# Patient Record
Sex: Female | Born: 1965 | Race: White | Hispanic: No | Smoking: Current every day smoker
Health system: Southern US, Community
[De-identification: ages and names within clinical notes are randomized; demographics above are authoritative.]

## PROBLEM LIST (undated history)

## (undated) DIAGNOSIS — E7800 Pure hypercholesterolemia, unspecified: Secondary | ICD-10-CM

## (undated) DIAGNOSIS — I10 Essential (primary) hypertension: Secondary | ICD-10-CM

## (undated) DIAGNOSIS — I639 Cerebral infarction, unspecified: Secondary | ICD-10-CM

## (undated) HISTORY — PX: ABDOMINAL HYSTERECTOMY: SHX81

## (undated) HISTORY — PX: TONSILLECTOMY: SUR1361

## (undated) HISTORY — DX: Pure hypercholesterolemia, unspecified: E78.00

## (undated) HISTORY — DX: Cerebral infarction, unspecified: I63.9

## (undated) HISTORY — DX: Essential (primary) hypertension: I10

## (undated) HISTORY — PX: LEEP: SHX91

---

## 2002-07-10 ENCOUNTER — Other Ambulatory Visit: Admission: RE | Admit: 2002-07-10 | Discharge: 2002-07-10 | Payer: Self-pay | Admitting: Gynecology

## 2002-12-24 ENCOUNTER — Other Ambulatory Visit: Admission: RE | Admit: 2002-12-24 | Discharge: 2002-12-24 | Payer: Self-pay | Admitting: Gynecology

## 2003-04-09 ENCOUNTER — Other Ambulatory Visit: Admission: RE | Admit: 2003-04-09 | Discharge: 2003-04-09 | Payer: Self-pay | Admitting: Gynecology

## 2003-11-17 ENCOUNTER — Other Ambulatory Visit: Admission: RE | Admit: 2003-11-17 | Discharge: 2003-11-17 | Payer: Self-pay | Admitting: Gynecology

## 2004-06-01 ENCOUNTER — Other Ambulatory Visit: Admission: RE | Admit: 2004-06-01 | Discharge: 2004-06-01 | Payer: Self-pay | Admitting: Gynecology

## 2004-11-17 ENCOUNTER — Other Ambulatory Visit: Admission: RE | Admit: 2004-11-17 | Discharge: 2004-11-17 | Payer: Self-pay | Admitting: Gynecology

## 2005-03-18 ENCOUNTER — Ambulatory Visit (HOSPITAL_COMMUNITY): Admission: RE | Admit: 2005-03-18 | Discharge: 2005-03-18 | Payer: Self-pay | Admitting: Gynecology

## 2005-07-19 ENCOUNTER — Other Ambulatory Visit: Admission: RE | Admit: 2005-07-19 | Discharge: 2005-07-19 | Payer: Self-pay | Admitting: Gynecology

## 2006-03-16 ENCOUNTER — Other Ambulatory Visit: Admission: RE | Admit: 2006-03-16 | Discharge: 2006-03-16 | Payer: Self-pay | Admitting: Gynecology

## 2006-05-26 ENCOUNTER — Ambulatory Visit (HOSPITAL_COMMUNITY): Admission: RE | Admit: 2006-05-26 | Discharge: 2006-05-26 | Payer: Self-pay | Admitting: Gynecology

## 2006-11-20 ENCOUNTER — Other Ambulatory Visit: Admission: RE | Admit: 2006-11-20 | Discharge: 2006-11-20 | Payer: Self-pay | Admitting: Gynecology

## 2007-05-08 ENCOUNTER — Other Ambulatory Visit: Admission: RE | Admit: 2007-05-08 | Discharge: 2007-05-08 | Payer: Self-pay | Admitting: Gynecology

## 2007-08-22 ENCOUNTER — Ambulatory Visit (HOSPITAL_COMMUNITY): Admission: RE | Admit: 2007-08-22 | Discharge: 2007-08-22 | Payer: Self-pay | Admitting: Gynecology

## 2007-11-21 ENCOUNTER — Other Ambulatory Visit: Admission: RE | Admit: 2007-11-21 | Discharge: 2007-11-21 | Payer: Self-pay | Admitting: Gynecology

## 2008-05-20 ENCOUNTER — Other Ambulatory Visit: Admission: RE | Admit: 2008-05-20 | Discharge: 2008-05-20 | Payer: Self-pay | Admitting: Gynecology

## 2008-12-18 ENCOUNTER — Ambulatory Visit (HOSPITAL_COMMUNITY): Admission: RE | Admit: 2008-12-18 | Discharge: 2008-12-18 | Payer: Self-pay | Admitting: Gynecology

## 2009-01-28 ENCOUNTER — Inpatient Hospital Stay (HOSPITAL_COMMUNITY): Admission: RE | Admit: 2009-01-28 | Discharge: 2009-01-29 | Payer: Self-pay | Admitting: Obstetrics and Gynecology

## 2009-01-28 ENCOUNTER — Encounter (INDEPENDENT_AMBULATORY_CARE_PROVIDER_SITE_OTHER): Payer: Self-pay | Admitting: Obstetrics and Gynecology

## 2009-09-27 ENCOUNTER — Ambulatory Visit: Payer: Self-pay | Admitting: Diagnostic Radiology

## 2009-09-27 ENCOUNTER — Emergency Department (HOSPITAL_BASED_OUTPATIENT_CLINIC_OR_DEPARTMENT_OTHER): Admission: EM | Admit: 2009-09-27 | Discharge: 2009-09-27 | Payer: Self-pay | Admitting: Emergency Medicine

## 2009-10-01 ENCOUNTER — Ambulatory Visit (HOSPITAL_BASED_OUTPATIENT_CLINIC_OR_DEPARTMENT_OTHER): Admission: RE | Admit: 2009-10-01 | Discharge: 2009-10-01 | Payer: Self-pay | Admitting: Orthopedic Surgery

## 2009-12-22 ENCOUNTER — Ambulatory Visit (HOSPITAL_COMMUNITY): Admission: RE | Admit: 2009-12-22 | Discharge: 2009-12-22 | Payer: Self-pay | Admitting: Gynecology

## 2010-06-21 ENCOUNTER — Ambulatory Visit (HOSPITAL_BASED_OUTPATIENT_CLINIC_OR_DEPARTMENT_OTHER)
Admission: RE | Admit: 2010-06-21 | Discharge: 2010-06-21 | Payer: Self-pay | Source: Home / Self Care | Attending: Family Medicine | Admitting: Family Medicine

## 2010-08-24 LAB — POCT HEMOGLOBIN-HEMACUE: Hemoglobin: 16.5 g/dL — ABNORMAL HIGH (ref 12.0–15.0)

## 2010-09-11 LAB — COMPREHENSIVE METABOLIC PANEL
ALT: 27 U/L (ref 0–35)
AST: 24 U/L (ref 0–37)
Albumin: 4.1 g/dL (ref 3.5–5.2)
Alkaline Phosphatase: 60 U/L (ref 39–117)
BUN: 12 mg/dL (ref 6–23)
CO2: 26 mEq/L (ref 19–32)
Calcium: 9.2 mg/dL (ref 8.4–10.5)
Chloride: 103 mEq/L (ref 96–112)
Creatinine, Ser: 0.67 mg/dL (ref 0.4–1.2)
GFR calc Af Amer: >60 mL/min (ref >60)
GFR calc non Af Amer: >60 mL/min (ref >60)
Glucose, Bld: 92 mg/dL (ref 70–99)
Potassium: 4.3 mEq/L (ref 3.5–5.1)
Sodium: 137 mEq/L (ref 135–145)
Total Bilirubin: 0.8 mg/dL (ref 0.3–1.2)
Total Protein: 7.3 g/dL (ref 6.0–8.3)

## 2010-09-11 LAB — CBC
HCT: 36.3 % (ref 36.0–46.0)
HCT: 46.6 % — ABNORMAL HIGH (ref 36.0–46.0)
Hemoglobin: 12.7 g/dL (ref 12.0–15.0)
Hemoglobin: 15.8 g/dL — ABNORMAL HIGH (ref 12.0–15.0)
MCHC: 34 g/dL (ref 30.0–36.0)
MCHC: 35.1 g/dL (ref 30.0–36.0)
MCV: 95.3 fL (ref 78.0–100.0)
MCV: 95.7 fL (ref 78.0–100.0)
Platelets: 318 10*3/uL (ref 150–400)
Platelets: 339 10*3/uL (ref 150–400)
RBC: 3.81 MIL/uL — ABNORMAL LOW (ref 3.87–5.11)
RBC: 4.87 MIL/uL (ref 3.87–5.11)
RDW: 12.9 % (ref 11.5–15.5)
RDW: 13.1 % (ref 11.5–15.5)
WBC: 11.4 10*3/uL — ABNORMAL HIGH (ref 4.0–10.5)
WBC: 18.9 10*3/uL — ABNORMAL HIGH (ref 4.0–10.5)

## 2010-09-11 LAB — PREGNANCY, URINE: Preg Test, Ur: NEGATIVE

## 2010-10-19 NOTE — Op Note (Signed)
Jasmin Cruz, Jasmin Cruz             ACCOUNT NO.:  [id number]   MEDICAL RECORD NO.:  [id number]          PATIENT TYPE:  INP   LOCATION:  9302                          FACILITY:  [facility]   PHYSICIAN:  Michelle L. Grewal, M.D.DATE OF BIRTH:  1965/09/25   DATE OF PROCEDURE:  01/28/2009  DATE OF DISCHARGE:                               OPERATIVE REPORT   PREOPERATIVE DIAGNOSIS:  Symptomatic fibroids.   POSTOPERATIVE DIAGNOSIS:  Symptomatic fibroids.   PROCEDURE:  Total abdominal hysterectomy.   SURGEON:  Michelle L. Vincente Poli, M.D.   ASSISTANT:  Dr. Greta Doom.   ANESTHESIA:  General.   FINDINGS:  A 750 g fibroid uterus sent to Pathology.   SPECIMENS:  Uterus and cervix.   ESTIMATED BLOOD LOSS:  200 mL.   DRAINS:  Foley catheter.   COMPLICATIONS:  None.   PROCEDURE IN DETAIL:  The patient was taken to the operating room.  She  had been consented in the office in regards to the risk associated with  surgery which included the risk of anesthesia, risk of injury, the bowel  and bladder surrounding organs, risk of bleeding, risk of transfusion,  risk of pulmonary embolism, risk of venous embolism and risk of  infection.  She was intubated without difficulty.  She was prepped and  draped in the usual sterile fashion.  Foley catheter was inserted.  Time-  out was performed according to the JCAHO guidelines.  A low transverse  incision was made, carried down to the fascia.  Fascia was scored in the  midline extended laterally.  The rectus muscle was separated in the  midline.  The peritoneum was entered bluntly.  The peritoneal incision  was then stretched.  The uterus was then exteriorized.  It was markedly  enlarged and multiple fibroids were noted.  The ovaries were  unremarkable and tubes appeared normal.  There were no adhesions noted.  We started with the hysterectomy by identifying the round ligament on  the left side but suture ligating that and transecting it with a Bovie,  creating  an avascular window beneath the triple pedicle and placing  curved Heaney clamp across the triple pedicle.  The pedicle was clamped  and cut and suture ligated using 0 Vicryl suture and then secured using  a free tie of 0 Vicryl suture.  This was done on the left side and the  right side identically and a bladder flap sharply and then the bladder  flap was then developed very well.  We then skeletonized uterine artery  on either side and the uterine artery was clamped at the level of  internal os using curved Heaney clamps on each side.  The pedicles were  clamped, cut, and suture ligated using 0 Vicryl suture.  The uterus was  then amputated so that we could see the cervix more clearly because the  uterus was so large because of the fibroids.  After we amputated the  uterine fundus, we then placed in a retractor and then placed the larger  small bowel in upper abdomen gently with some laparotomy pads.  We then  grasped the cervix with  a Kocher clamp and developed the bladder flap  further with sharp and blunt dissection and then used a series of  straight curved Heaney clamps by staying just snug beside the cervix and  clamping the uterosacral cardinal ligaments.  Each pedicle was clamped,  cut, and suture ligated using 0 Vicryl suture.  We then could easily see  the external os kind of bulging up anteriorly, so we placed curved  Heaney clamps just beneath the external os, making sure the bladder was  well below our clamp and then the cervix was removed using Mayo  scissors.  The angle stitches were placed using 0 Vicryl suture  interrupted and remainder of cuff was closed with 0 Vicryl figure-of-  eight.  The specimen was 750 g and the uterus and cervix were sent  together to pathology.  Irrigation was performed.  There was no bleeding  noted whatsoever.  All instruments and laparotomy pads were removed.  The peritoneum was closed using 0 Vicryl.  The rectus muscles were  reapproximated  using 0 Vicryl.  The fascia was closed using 0 Vicryl  starting each corner meeting in the midline.  After irrigation of  subcutaneous layer, the skin was closed with staples.  All sponge, lap  and instrument counts were correct x2.  The patient went to recovery  room in stable condition.      Michelle L. Vincente Poli, M.D.  Electronically Signed     MLG/MEDQ  D:  01/28/2009  T:  01/29/2009  Job:  [id number]

## 2010-10-19 NOTE — H&P (Signed)
Jasmin Cruz, Jasmin Cruz             ACCOUNT NO.:  [id number]   MEDICAL RECORD NO.:  [id number]          PATIENT TYPE:  AMB   LOCATION:  SDC                           FACILITY:  [facility]   PHYSICIAN:  Michelle L. Grewal, M.D.DATE OF BIRTH:  [birth year]   DATE OF ADMISSION:  DATE OF DISCHARGE:                              HISTORY & PHYSICAL   HISTORY OF PRESENT ILLNESS:  This is a 45 year old gravida 1, para 0 who  presents today for TAH, possible BSO.  She has been having problems with  pelvic pressure, dribbling of urine, and irregular bleeding with large  clots.  She is currently on generic Micronor.  She had an ultrasound  that showed multiple fibroids, some as large as 8 cm.  She also reports  a lot of bloating in her lower abdomen.  She is scheduled today for TAH,  possible BSO.   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   FAMILY HISTORY:  Remarkable for heart disease, kidney disease, diabetes,  and lung cancers.   CURRENT MEDICATIONS:  Generic Micronor.   SURGERIES:  None.   ALLERGIES:  None.   SOCIAL HISTORY:  She does smoke one half pack per day.  She reports  occasional alcohol use.  She is currently divorced.   PRIMARY CARE PHYSICIAN:  [facility] physicians.   REVIEW OF SYSTEMS:  Positive for weight loss and gain.   PHYSICAL EXAMINATION:  VITAL SIGNS:  Height 5 feet 3-1/2 inches, weight  179, and BP 128/82.  GENERAL:  Alert and oriented in no apparent distress.  LUNGS:  Clear to auscultation bilaterally.  CARDIAC:  Regular rate and rhythm.  BREASTS:  Soft and nontender.  No masses.  ABDOMEN:  Protuberant and mass noted at the umbilicus.  PELVIC:  External genitalia within normal limits.  Vagina appears  normal.  Cervix, no lesions.  Uterus is approximately 18 weeks' size.   IMPRESSION:  Symptomatic fibroids.   PLAN:  We will proceed with TAH, possible BSO.  The patient requested  nicotine patch in the  hospital postop.  Risk of the procedure was  discussed with the patient, which include the risk of infection,  bleeding, anesthesia, injury to internal organs such as bowel and  bladder, and risk of pulmonary embolism and thromboembolism.  We will  proceed with surgery.      Michelle L. Vincente Poli, M.D.  Electronically Signed     MLG/MEDQ  D:  01/27/2009  T:  01/28/2009  Job:  [id number]

## 2010-12-14 ENCOUNTER — Other Ambulatory Visit (HOSPITAL_COMMUNITY): Payer: Self-pay | Admitting: Gynecology

## 2010-12-14 ENCOUNTER — Other Ambulatory Visit (HOSPITAL_BASED_OUTPATIENT_CLINIC_OR_DEPARTMENT_OTHER): Payer: Self-pay | Admitting: Gynecology

## 2010-12-14 DIAGNOSIS — Z1231 Encounter for screening mammogram for malignant neoplasm of breast: Secondary | ICD-10-CM

## 2010-12-29 ENCOUNTER — Ambulatory Visit (HOSPITAL_BASED_OUTPATIENT_CLINIC_OR_DEPARTMENT_OTHER)
Admission: RE | Admit: 2010-12-29 | Discharge: 2010-12-29 | Disposition: A | Discharge status: Home / Self Care | Payer: BC Managed Care – PPO | Source: Ambulatory Visit | Attending: Gynecology | Admitting: Gynecology

## 2010-12-29 DIAGNOSIS — Z1231 Encounter for screening mammogram for malignant neoplasm of breast: Secondary | ICD-10-CM | POA: Insufficient documentation

## 2011-01-04 ENCOUNTER — Other Ambulatory Visit: Payer: Self-pay | Admitting: Gynecology

## 2011-01-04 DIAGNOSIS — R928 Other abnormal and inconclusive findings on diagnostic imaging of breast: Secondary | ICD-10-CM

## 2011-01-12 ENCOUNTER — Ambulatory Visit
Admission: RE | Admit: 2011-01-12 | Discharge: 2011-01-12 | Disposition: A | Discharge status: Home / Self Care | Payer: BC Managed Care – PPO | Source: Ambulatory Visit | Attending: Gynecology | Admitting: Gynecology

## 2011-01-12 DIAGNOSIS — R928 Other abnormal and inconclusive findings on diagnostic imaging of breast: Secondary | ICD-10-CM

## 2012-01-11 ENCOUNTER — Ambulatory Visit (HOSPITAL_BASED_OUTPATIENT_CLINIC_OR_DEPARTMENT_OTHER)
Admission: RE | Admit: 2012-01-11 | Discharge: 2012-01-11 | Disposition: A | Discharge status: Home / Self Care | Payer: BC Managed Care – PPO | Source: Ambulatory Visit | Attending: Gynecology | Admitting: Gynecology

## 2012-01-11 ENCOUNTER — Other Ambulatory Visit (HOSPITAL_BASED_OUTPATIENT_CLINIC_OR_DEPARTMENT_OTHER): Payer: Self-pay | Admitting: Gynecology

## 2012-01-11 DIAGNOSIS — Z1231 Encounter for screening mammogram for malignant neoplasm of breast: Secondary | ICD-10-CM

## 2012-02-27 ENCOUNTER — Inpatient Hospital Stay (HOSPITAL_COMMUNITY)
Admission: EM | Admit: 2012-02-27 | Discharge: 2012-03-02 | DRG: 533 | Disposition: A | Discharge status: Home / Self Care | Payer: BC Managed Care – PPO | Source: Other Acute Inpatient Hospital | Attending: Neurology | Admitting: Neurology

## 2012-02-27 ENCOUNTER — Encounter (HOSPITAL_COMMUNITY): Payer: Self-pay | Admitting: *Deleted

## 2012-02-27 ENCOUNTER — Inpatient Hospital Stay (HOSPITAL_COMMUNITY): Payer: BC Managed Care – PPO

## 2012-02-27 DIAGNOSIS — Z79899 Other long term (current) drug therapy: Secondary | ICD-10-CM

## 2012-02-27 DIAGNOSIS — E785 Hyperlipidemia, unspecified: Secondary | ICD-10-CM | POA: Diagnosis present

## 2012-02-27 DIAGNOSIS — R51 Headache: Secondary | ICD-10-CM | POA: Diagnosis present

## 2012-02-27 DIAGNOSIS — I677 Cerebral arteritis, not elsewhere classified: Secondary | ICD-10-CM | POA: Diagnosis present

## 2012-02-27 DIAGNOSIS — H5340 Unspecified visual field defects: Secondary | ICD-10-CM | POA: Diagnosis present

## 2012-02-27 DIAGNOSIS — F1210 Cannabis abuse, uncomplicated: Secondary | ICD-10-CM | POA: Diagnosis present

## 2012-02-27 DIAGNOSIS — I10 Essential (primary) hypertension: Secondary | ICD-10-CM | POA: Diagnosis present

## 2012-02-27 DIAGNOSIS — I609 Nontraumatic subarachnoid hemorrhage, unspecified: Principal | ICD-10-CM | POA: Diagnosis present

## 2012-02-27 DIAGNOSIS — I498 Other specified cardiac arrhythmias: Secondary | ICD-10-CM | POA: Diagnosis present

## 2012-02-27 DIAGNOSIS — H53469 Homonymous bilateral field defects, unspecified side: Secondary | ICD-10-CM | POA: Diagnosis present

## 2012-02-27 DIAGNOSIS — R29898 Other symptoms and signs involving the musculoskeletal system: Secondary | ICD-10-CM | POA: Diagnosis present

## 2012-02-27 DIAGNOSIS — I67848 Other cerebrovascular vasospasm and vasoconstriction: Secondary | ICD-10-CM | POA: Diagnosis not present

## 2012-02-27 DIAGNOSIS — I6350 Cerebral infarction due to unspecified occlusion or stenosis of unspecified cerebral artery: Secondary | ICD-10-CM | POA: Diagnosis present

## 2012-02-27 DIAGNOSIS — F17200 Nicotine dependence, unspecified, uncomplicated: Secondary | ICD-10-CM | POA: Diagnosis present

## 2012-02-27 DIAGNOSIS — Z6831 Body mass index (BMI) 31.0-31.9, adult: Secondary | ICD-10-CM

## 2012-02-27 DIAGNOSIS — R519 Headache, unspecified: Secondary | ICD-10-CM | POA: Diagnosis present

## 2012-02-27 DIAGNOSIS — E669 Obesity, unspecified: Secondary | ICD-10-CM | POA: Diagnosis present

## 2012-02-27 DIAGNOSIS — R209 Unspecified disturbances of skin sensation: Secondary | ICD-10-CM | POA: Diagnosis present

## 2012-02-27 LAB — GLUCOSE, CAPILLARY
Glucose-Capillary: 105 mg/dL — ABNORMAL HIGH (ref 70–99)
Glucose-Capillary: 121 mg/dL — ABNORMAL HIGH (ref 70–99)

## 2012-02-27 LAB — SEDIMENTATION RATE: Sed Rate: 5 mm/hr (ref 0–22)

## 2012-02-27 LAB — MRSA PCR SCREENING: MRSA by PCR: NEGATIVE

## 2012-02-27 MED ORDER — SODIUM CHLORIDE 0.9 % IV SOLN
INTRAVENOUS | Status: DC
  Administered 2012-02-27: 100 mL/h via INTRAVENOUS
  Administered 2012-02-27 – 2012-03-01 (×5): via INTRAVENOUS

## 2012-02-27 MED ORDER — HYDROMORPHONE HCL PF 1 MG/ML IJ SOLN
0.5000 mg | INTRAMUSCULAR | Status: DC | PRN
  Administered 2012-02-27 – 2012-02-28 (×5): 0.5 mg via INTRAVENOUS
  Filled 2012-02-27 (×5): qty 1

## 2012-02-27 MED ORDER — LABETALOL HCL 5 MG/ML IV SOLN
10.0000 mg | INTRAVENOUS | Status: DC | PRN
  Administered 2012-02-28 – 2012-02-29 (×4): 20 mg via INTRAVENOUS
  Filled 2012-02-27 (×6): qty 4

## 2012-02-27 MED ORDER — GADOBENATE DIMEGLUMINE 529 MG/ML IV SOLN
17.0000 mL | Freq: Once | INTRAVENOUS | Status: AC
  Administered 2012-02-27: 17 mL via INTRAVENOUS

## 2012-02-27 MED ORDER — SENNOSIDES-DOCUSATE SODIUM 8.6-50 MG PO TABS
1.0000 | ORAL_TABLET | Freq: Two times a day (BID) | ORAL | Status: DC
  Administered 2012-02-27 – 2012-03-02 (×8): 1 via ORAL
  Filled 2012-02-27 (×9): qty 1

## 2012-02-27 MED ORDER — TRAMADOL HCL 50 MG PO TABS
50.0000 mg | ORAL_TABLET | Freq: Four times a day (QID) | ORAL | Status: DC | PRN
  Administered 2012-02-27 – 2012-02-28 (×2): 100 mg via ORAL
  Filled 2012-02-27 (×2): qty 2

## 2012-02-27 MED ORDER — ACETAMINOPHEN 325 MG PO TABS
650.0000 mg | ORAL_TABLET | ORAL | Status: DC | PRN
  Administered 2012-03-01 – 2012-03-02 (×4): 650 mg via ORAL
  Filled 2012-02-27 (×4): qty 2

## 2012-02-27 MED ORDER — PANTOPRAZOLE SODIUM 40 MG IV SOLR
40.0000 mg | Freq: Every day | INTRAVENOUS | Status: DC
  Administered 2012-02-27 – 2012-02-29 (×3): 40 mg via INTRAVENOUS
  Filled 2012-02-27 (×4): qty 40

## 2012-02-27 MED ORDER — ACETAMINOPHEN 650 MG RE SUPP: 650.0000 mg | RECTAL | Status: DC | PRN

## 2012-02-27 MED ORDER — DEXAMETHASONE SODIUM PHOSPHATE 10 MG/ML IJ SOLN
10.0000 mg | Freq: Four times a day (QID) | INTRAMUSCULAR | Status: DC
  Administered 2012-02-27 – 2012-02-28 (×3): 10 mg via INTRAVENOUS
  Filled 2012-02-27 (×10): qty 1

## 2012-02-27 MED ORDER — ONDANSETRON HCL 4 MG/2ML IJ SOLN
4.0000 mg | Freq: Four times a day (QID) | INTRAMUSCULAR | Status: DC | PRN
  Administered 2012-02-27 – 2012-02-28 (×3): 4 mg via INTRAVENOUS
  Filled 2012-02-27 (×3): qty 2

## 2012-02-27 NOTE — Progress Notes (Signed)
Patient's heart rate decreased to 45 bpm while sleeping.  Aroused patient and heart rate increased to 60-70 bpm.  Patient asymptomatic upon assessment.  Dr. Jordan Likes made aware of condition.  Will continue to monitor

## 2012-02-27 NOTE — H&P (Signed)
Jasmin Cruz is an 46 y.o. female.   Chief Complaint: Headache HPI: 46 year old female with a 10 day history of severe headache. The symptoms began rather suddenly without precipitating event. Patient initially sought out medical care where she was found to be significant hypertensive. Headaches have persisted. Patient was started on lisinopril for hypertension. Since that time she's had some intermittent coughing and has had some difficulty with occasional right upper and lower trimming paresthesias. She denies any weakness. She's had no speech difficulty. She denies any history trauma. She has a prior history of self-limited severe headaches but this seems different in character to her.  History reviewed. No pertinent past medical history.  Past Surgical History  Procedure Date  . Tonsillectomy   . Abdominal hysterectomy     History reviewed. No pertinent family history. Social History:  reports that she has been smoking Cigarettes.  She has a 30 pack-year smoking history. She does not have any smokeless tobacco history on file. She reports that she drinks about 1.8 ounces of alcohol per week. Her drug history not on file.  Allergies: No Known Allergies  No prescriptions prior to admission    No results found for this or any previous visit (from the past 48 hour(s)). No results found.  Review of Systems  Constitutional: Negative.   HENT: Negative for hearing loss, ear pain, nosebleeds, congestion, sore throat, tinnitus and ear discharge.   Eyes: Negative.   Respiratory: Negative.  Negative for stridor.   Cardiovascular: Negative.   Gastrointestinal: Negative.   Genitourinary: Negative.   Musculoskeletal: Negative.   Skin: Negative.   Neurological: Positive for tingling, sensory change and headaches. Negative for dizziness, tremors, speech change, focal weakness, seizures and loss of consciousness.  Endo/Heme/Allergies: Negative.   Psychiatric/Behavioral: Negative.      Blood pressure 136/84, pulse 70, temperature 97.9 F (36.6 C), temperature source Oral, resp. rate 18, height 5\' 3"  (1.6 m), weight 80.5 kg (177 lb 7.5 oz), SpO2 98.00%. Physical Exam  Vitals reviewed. Constitutional: She is oriented to person, place, and time. She appears well-developed and well-nourished. No distress.  HENT:  Head: Normocephalic and atraumatic.  Right Ear: External ear normal.  Left Ear: External ear normal.  Nose: Nose normal.  Mouth/Throat: Oropharynx is clear and moist.  Eyes: Conjunctivae normal and EOM are normal. Pupils are equal, round, and reactive to light. Right eye exhibits no discharge. Left eye exhibits no discharge.  Neck: Normal range of motion. Neck supple. No tracheal deviation present. No thyromegaly present.  Cardiovascular: Normal rate, regular rhythm, normal heart sounds and intact distal pulses.   Respiratory: Effort normal. No respiratory distress. She has no wheezes.  GI: Soft. Bowel sounds are normal. She exhibits no distension. There is no tenderness.  Musculoskeletal: Normal range of motion. She exhibits no edema and no tenderness.  Neurological: She is alert and oriented to person, place, and time. She has normal reflexes. She displays normal reflexes. No cranial nerve deficit. She exhibits normal muscle tone. Coordination normal.  Skin: Skin is warm and dry. No rash noted. She is not diaphoretic. No erythema. No pallor.  Psychiatric: She has a normal mood and affect. Her behavior is normal. Judgment and thought content normal.    CT scan of the head demonstrates evidence of a small amount of subarachnoid blood high along the convexity in her right frontal region. There is no evidence of blood within the basilar cisterns. There is no evidence of edema midline shift or mass lesion anyway. There  basilar cisterns are widely patent. Ventricles are normal in size. There is nothing to suggest venous occlusion.   Assessment/Plan Spontaneous right  frontal subarachnoid hemorrhage of unknown cause. 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 We will admit the ICU for observation. Due to MRI scan with MRA in the morning. Watch blood pressure and treat headaches symptomatically. Nesta Scaturro A 02/27/2012, 4:35 AM

## 2012-02-27 NOTE — Progress Notes (Signed)
MRI scan demonstrates no evidence of intracranial aneurysm. There are numerous areas of vascular constriction consistent with a vasculitis. I would obtain inflammatory markers and start IV steroids. Continue observation.

## 2012-02-27 NOTE — Progress Notes (Signed)
Dr. Jordan Likes at patient's bedside discussing plan of care with patient and sister. Both are understanding.

## 2012-02-27 NOTE — Progress Notes (Signed)
PT  Cancellation Note  Treatment cancelled today due to Pt. not ready for PT yet.  PT to begin 02/28/12.  Will follow up tomorrow for eval..  Ferman Hamming 02/27/2012, 7:37 AM Weldon Picking PT Acute Rehab Services [phone] [place] [phone]

## 2012-02-28 ENCOUNTER — Encounter (HOSPITAL_COMMUNITY): Payer: Self-pay | Admitting: Radiology

## 2012-02-28 DIAGNOSIS — I609 Nontraumatic subarachnoid hemorrhage, unspecified: Secondary | ICD-10-CM | POA: Diagnosis present

## 2012-02-28 DIAGNOSIS — I6350 Cerebral infarction due to unspecified occlusion or stenosis of unspecified cerebral artery: Secondary | ICD-10-CM | POA: Diagnosis present

## 2012-02-28 DIAGNOSIS — R519 Headache, unspecified: Secondary | ICD-10-CM | POA: Diagnosis present

## 2012-02-28 DIAGNOSIS — E785 Hyperlipidemia, unspecified: Secondary | ICD-10-CM

## 2012-02-28 DIAGNOSIS — R51 Headache: Secondary | ICD-10-CM | POA: Diagnosis present

## 2012-02-28 DIAGNOSIS — I10 Essential (primary) hypertension: Secondary | ICD-10-CM

## 2012-02-28 LAB — GLUCOSE, CAPILLARY
Glucose-Capillary: 140 mg/dL — ABNORMAL HIGH (ref 70–99)
Glucose-Capillary: 135 mg/dL — ABNORMAL HIGH (ref 70–99)

## 2012-02-28 LAB — BASIC METABOLIC PANEL
CO2: 23 mEq/L (ref 19–32)
Calcium: 9.2 mg/dL (ref 8.4–10.5)
GFR calc non Af Amer: >90 mL/min (ref >90)
Glucose, Bld: 142 mg/dL — ABNORMAL HIGH (ref 70–99)
Potassium: 3.5 mEq/L (ref 3.5–5.1)
Sodium: 139 mEq/L (ref 135–145)

## 2012-02-28 LAB — C-REACTIVE PROTEIN: CRP: <0.5 mg/dL — ABNORMAL LOW

## 2012-02-28 LAB — LIPID PANEL
Cholesterol: 186 mg/dL (ref 0–200)
Total CHOL/HDL Ratio: 6.2 RATIO
VLDL: 23 mg/dL (ref 0–40)

## 2012-02-28 LAB — APTT: aPTT: 33 seconds (ref 24–37)

## 2012-02-28 LAB — CBC WITH DIFFERENTIAL/PLATELET
Basophils Absolute: 0 10*3/uL (ref 0.0–0.1)
Lymphocytes Relative: 7 % — ABNORMAL LOW (ref 12–46)
Lymphs Abs: 0.9 10*3/uL (ref 0.7–4.0)
Neutrophils Relative %: 92 % — ABNORMAL HIGH (ref 43–77)
Platelets: 225 10*3/uL (ref 150–400)
RBC: 5.06 MIL/uL (ref 3.87–5.11)
RDW: 12.9 % (ref 11.5–15.5)
WBC: 11.5 10*3/uL — ABNORMAL HIGH (ref 4.0–10.5)

## 2012-02-28 LAB — PROTIME-INR
INR: 0.99 (ref 0.00–1.49)
Prothrombin Time: 13 seconds (ref 11.6–15.2)

## 2012-02-28 LAB — SEDIMENTATION RATE: Sed Rate: 3 mm/hr (ref 0–22)

## 2012-02-28 MED ORDER — METHYLPREDNISOLONE 4 MG PO KIT
4.0000 mg | PACK | Freq: Four times a day (QID) | ORAL | Status: DC
  Administered 2012-03-01 – 2012-03-02 (×6): 4 mg via ORAL

## 2012-02-28 MED ORDER — METHYLPREDNISOLONE 4 MG PO KIT
8.0000 mg | PACK | Freq: Every morning | ORAL | Status: AC
  Administered 2012-02-28: 8 mg via ORAL
  Filled 2012-02-28: qty 21

## 2012-02-28 MED ORDER — METHYLPREDNISOLONE 4 MG PO KIT
4.0000 mg | PACK | Freq: Three times a day (TID) | ORAL | Status: AC
  Administered 2012-02-29 (×2): 4 mg via ORAL

## 2012-02-28 MED ORDER — METHYLPREDNISOLONE 4 MG PO KIT
4.0000 mg | PACK | ORAL | Status: AC
  Administered 2012-02-28: 4 mg via ORAL

## 2012-02-28 MED ORDER — LABETALOL HCL 5 MG/ML IV SOLN
20.0000 mg | INTRAVENOUS | Status: DC | PRN
  Administered 2012-02-28: 20 mg via INTRAVENOUS

## 2012-02-28 MED ORDER — TOPIRAMATE 25 MG PO TABS
25.0000 mg | ORAL_TABLET | Freq: Two times a day (BID) | ORAL | Status: DC
  Administered 2012-02-28 – 2012-03-02 (×7): 25 mg via ORAL
  Filled 2012-02-28 (×8): qty 1

## 2012-02-28 MED ORDER — METHYLPREDNISOLONE 4 MG PO KIT
8.0000 mg | PACK | Freq: Every evening | ORAL | Status: AC
  Administered 2012-02-28: 8 mg via ORAL

## 2012-02-28 MED ORDER — METHYLPREDNISOLONE 4 MG PO KIT
8.0000 mg | PACK | Freq: Every evening | ORAL | Status: AC
  Administered 2012-02-29: 8 mg via ORAL

## 2012-02-28 MED ORDER — LISINOPRIL 5 MG PO TABS
5.0000 mg | ORAL_TABLET | Freq: Every day | ORAL | Status: DC
  Administered 2012-02-28 – 2012-02-29 (×2): 5 mg via ORAL
  Filled 2012-02-28 (×2): qty 1

## 2012-02-28 NOTE — Evaluation (Signed)
Physical Therapy Evaluation Patient Details Name: Jasmin Cruz MRN: [id number] DOB: 12/07/1965 Today's Date: 02/28/2012 Time: 0930-1003 PT Time Calculation (min): 33 min  PT Assessment / Plan / Recommendation Clinical Impression  Pt is a 46 y/o female admitted s/p spontaneous frontal SAH along with the below PT problem list.  Pt would benefit from acute PT to maximize independence and facilitate d/c home with possible outpatient PT to follow up if needed.    PT Assessment  Patient needs continued PT services    Follow Up Recommendations  Outpatient PT    Barriers to Discharge None      Equipment Recommendations  None recommended by PT    Recommendations for Other Services     Frequency Min 3X/week    Precautions / Restrictions Precautions Precautions: Fall Restrictions Weight Bearing Restrictions: No   Pertinent Vitals/Pain 3/10 in head.  RN aware with pt repositioned.      Mobility  Bed Mobility Bed Mobility: Supine to Sit Supine to Sit: 4: Min guard Details for Bed Mobility Assistance: Guarding for balance.  Cues for safe sequence. Transfers Transfers: Sit to Stand;Stand to Sit Sit to Stand: 4: Min assist;With upper extremity assist;From bed Stand to Sit: 4: Min assist;With upper extremity assist;To chair/3-in-1 Details for Transfer Assistance: Assist for balance due to dizziness with cues for safe sequence. Ambulation/Gait Ambulation/Gait Assistance: 4: Min assist Ambulation Distance (Feet): 5 Feet Assistive device: None Ambulation/Gait Assistance Details: Assist for balance due to dizziness.  Distance limited by dizziness and increased BP up to 150s/60s. Gait Pattern: Step-through pattern;Trunk flexed Stairs: No Wheelchair Mobility Wheelchair Mobility: No    Exercises     PT Diagnosis: Difficulty walking  PT Problem List: Decreased activity tolerance;Decreased balance;Decreased mobility;Pain PT Treatment Interventions: Gait training;Stair  training;Functional mobility training;Therapeutic activities;Balance training;Neuromuscular re-education;Patient/family education   PT Goals Acute Rehab PT Goals PT Goal Formulation: With patient Time For Goal Achievement: 03/06/12 Potential to Achieve Goals: Good Pt will go Supine/Side to Sit: with modified independence PT Goal: Supine/Side to Sit - Progress: Goal set today Pt will go Sit to Supine/Side: with modified independence PT Goal: Sit to Supine/Side - Progress: Goal set today Pt will go Sit to Stand: with modified independence PT Goal: Sit to Stand - Progress: Goal set today Pt will go Stand to Sit: with modified independence PT Goal: Stand to Sit - Progress: Goal set today Pt will Ambulate: >150 feet;with modified independence;with least restrictive assistive device PT Goal: Ambulate - Progress: Goal set today Pt will Go Up / Down Stairs: 3-5 stairs;with modified independence;with least restrictive assistive device PT Goal: Up/Down Stairs - Progress: Goal set today  Visit Information  Last PT Received On: 02/28/12 Assistance Needed: +1 PT/OT Co-Evaluation/Treatment: Yes    Subjective Data  Subjective: "I can work it out." Patient Stated Goal: Go home   Prior Functioning  Home Living Lives With: Alone Available Help at Discharge: Available 24 hours/day (If needed can arrange this.) Type of Home: House Home Access: Stairs to enter [employer] of Steps: 3 Entrance Stairs-Rails: Can reach both Home Layout: One level Bathroom Shower/Tub: Walk-in shower;Door [employer] Toilet: Standard Bathroom Accessibility: Yes How Accessible: Accessible via walker Home Adaptive Equipment: None Additional Comments: Wears glasses for distance only. 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 Communication Communication: No difficulties    Cognition  Overall  Cognitive Status: Appears within functional limits for tasks assessed/performed Arousal/Alertness: Awake/alert Orientation Level: Appears  intact for tasks assessed Behavior During Session: [facility] for tasks performed    Extremity/Trunk Assessment Right Upper Extremity Assessment RUE ROM/Strength/Tone: Within functional levels Left Upper Extremity Assessment LUE ROM/Strength/Tone: Within functional levels Right Lower Extremity Assessment RLE ROM/Strength/Tone: Within functional levels RLE Sensation: WFL - Light Touch RLE Coordination: WFL - gross motor Left Lower Extremity Assessment LLE ROM/Strength/Tone: Within functional levels LLE Sensation: WFL - Light Touch LLE Coordination: WFL - gross motor Trunk Assessment Trunk Assessment: Normal   Balance Balance Balance Assessed: No  End of Session PT - End of Session Equipment Utilized During Treatment: Gait belt Activity Tolerance: Patient tolerated treatment well Patient left: in chair;with call bell/phone within reach Nurse Communication: Mobility status  GP     Cephus Shelling 02/28/2012, 10:20 AM  02/28/2012 Cephus Shelling, PT, DPT [phone]

## 2012-02-28 NOTE — Progress Notes (Signed)
Occupational Therapy Evaluation Patient Details Name: Jasmin Cruz MRN: [id number] DOB: Nov 23, 1965 Today's Date: 02/28/2012 Time: 0930-1005 OT Time Calculation (min): 35 min  OT Assessment / Plan / Recommendation Clinical Impression  46 yo s/p spontaneous frontal SAH. Pt will benefit from skilled Ot sevices to max independence with ADL and functional moiblity for ADL secondary to deficits below. will further assess vision and higher level cognitive skills. Pt may benefit from outpt OT services after D/C pending progress. will follow acutely.    OT Assessment  Patient needs continued OT Services    Follow Up Recommendations  Outpatient OT    Barriers to Discharge None    Equipment Recommendations  Tub/shower seat    Recommendations for Other Services    Frequency  Min 2X/week    Precautions / Restrictions Precautions Precautions: Fall Restrictions Weight Bearing Restrictions: No   Pertinent Vitals/Pain 3. HA. BP sitting 135/76. After bed mobility 146/80. After transfer to chair 151/85. End of session 142/67.    ADL  Eating/Feeding: Simulated;Supervision/safety Where Assessed - Eating/Feeding: Chair Grooming: Simulated;Set up Where Assessed - Grooming: Supported sitting Upper Body Bathing: Simulated;Set up Where Assessed - Upper Body Bathing: Supported sitting Lower Body Bathing: Simulated;Minimal assistance Where Assessed - Lower Body Bathing: Supported sit to stand Upper Body Dressing: Simulated;Set up Where Assessed - Upper Body Dressing: Supported sitting Lower Body Dressing: Simulated;Minimal assistance Where Assessed - Lower Body Dressing: Supported sit to stand Toilet Transfer: Mining engineer Method: Sit to stand;Stand pivot Acupuncturist: Other (comment) (bed - chair) Toileting - Clothing Manipulation and Hygiene: Simulated;Supervision/safety Where Assessed - Glass blower/designer Manipulation and Hygiene:  Standing Equipment Used: Gait belt Transfers/Ambulation Related to ADLs: Min a to stabilize ADL Comments: Pt limited at this time due to c/o dizziness and reports of visual changes    OT Diagnosis: Generalized weakness;Disturbance of vision;Acute pain  OT Problem List: Decreased strength;Decreased activity tolerance;Impaired balance (sitting and/or standing);Cardiopulmonary status limiting activity;Impaired vision/perception;Decreased cognition OT Treatment Interventions: Self-care/ADL training;Therapeutic exercise;Energy conservation;DME and/or AE instruction;Therapeutic activities;Visual/perceptual remediation/compensation;Patient/family education;Balance training   OT Goals Acute Rehab OT Goals OT Goal Formulation: With patient Time For Goal Achievement: 03/13/12 Potential to Achieve Goals: Good ADL Goals Pt Will Perform Lower Body Bathing: with set-up;with caregiver independent in assisting;Sit to stand from chair;Unsupported ADL Goal: Lower Body Bathing - Progress: Goal set today Pt Will Perform Lower Body Dressing: with supervision;Sit to stand from chair;Unsupported;with caregiver independent in assisting ADL Goal: Lower Body Dressing - Progress: Goal set today Pt Will Transfer to Toilet: with supervision;Comfort height toilet ADL Goal: Toilet Transfer - Progress: Goal set today Miscellaneous OT Goals Miscellaneous OT Goal #1: pt will complete visual scanning task, incorporating multistep problem solving skills without vc.  OT Goal: Miscellaneous Goal #1 - Progress: Goal set today Miscellaneous OT Goal #2: Pt will demonstrate anticipatory awareness during functional task and self correct independently. OT Goal: Miscellaneous Goal #2 - Progress: Goal set today  Visit Information  Last OT Received On: 02/28/12 Assistance Needed: +1 PT/OT Co-Evaluation/Treatment: Yes    Subjective Data      Prior Functioning  Vision/Perception  Home Living Lives With: Alone Available Help  at Discharge: Available 24 hours/day (If needed can arrange this.) Type of Home: House Home Access: Stairs to enter [employer] of Steps: 3 Entrance Stairs-Rails: Can reach both Home Layout: One level Bathroom Shower/Tub: Walk-in shower;Door [employer] Toilet: Standard Bathroom Accessibility: Yes How Accessible: Accessible via walker Home Adaptive Equipment: None Additional Comments: Wears glasses for distance only.  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 Communication Communication: No difficulties Dominant Hand: Right   Vision - Assessment Eye Alignment: Within Functional Limits Vision Assessment: Vision impaired - to be further tested in functional context Additional Comments: Pt c/o blurring of vision that is different from baseline. Occular ROM appears to be [facility]. Will further assess. Perception Perception: Within Functional Limits Praxis Praxis: Intact  Cognition  Overall Cognitive Status: Appears within functional limits for tasks assessed/performed Arousal/Alertness: Awake/alert Orientation Level: Appears intact for tasks assessed Behavior During Session: [facility] for tasks performed Cognition - Other Comments: Will further evaluate executive level skills    Extremity/Trunk Assessment Right Upper Extremity Assessment RUE ROM/Strength/Tone: Within functional levels RUE Sensation: WFL - Light Touch;WFL - Proprioception RUE Coordination: WFL - gross/fine motor Left Upper Extremity Assessment LUE ROM/Strength/Tone: Within functional levels LUE Sensation: WFL - Light Touch;WFL - Proprioception LUE Coordination: WFL - gross/fine motor Right Lower Extremity Assessment RLE ROM/Strength/Tone: Within functional levels RLE Sensation: WFL - Light Touch RLE Coordination: WFL - gross motor Left Lower Extremity Assessment LLE ROM/Strength/Tone: Within functional  levels LLE Sensation: WFL - Light Touch LLE Coordination: WFL - gross motor Trunk Assessment Trunk Assessment: Normal   Mobility  Shoulder Instructions  Bed Mobility Bed Mobility: Supine to Sit Supine to Sit: 4: Min guard Details for Bed Mobility Assistance: Guarding for balance.  Cues for safe sequence. Transfers Transfers: Sit to Stand;Stand to Sit Sit to Stand: 4: Min assist;With upper extremity assist;From bed Stand to Sit: 4: Min assist;With upper extremity assist;To chair/3-in-1 Details for Transfer Assistance: Assist for balance due to dizziness with cues for safe sequence.       Exercise     Balance Balance Balance Assessed: No   End of Session OT - End of Session Equipment Utilized During Treatment: Gait belt Activity Tolerance: Treatment limited secondary to medical complications (Comment) (BP parameters) Patient left: in chair;with call bell/phone within reach Nurse Communication: Precautions  GO     Earnie Rockhold,HILLARY 02/28/2012, 10:30 AM Luisa Dago, OTR/L  [phone] 02/28/2012

## 2012-02-28 NOTE — Consult Note (Signed)
Stroke Consult    Chief Complaint: Headache HPI: Jasmin Cruz is an 46 y.o. female  with a 10 day history of severe new onset headaches. The symptoms began rather suddenly without precipitating event. Patient initially sought out medical care where she was found to be significant hypertensive. Review of her dairy show SBP 140s to 180s .Headaches have persisted. Patient was started on lisinopril for hypertension 4 days ago. Since that time she's had some intermittent coughing and has had some difficulty with occasional right upper and lower trimming paresthesias. She denies any weakness. She's had no speech difficulty. She denies any history trauma. She has a prior history of self-limited severe headaches but this seems different in character to her.she admits to mild nausea and vomiting occasionally but denies light or sound sensitivity. She denies any focal neurological symptoms .[name]  She has no history of strokes, TIAs or migraines. She does smoke but denies history of birth control pills.  There is no family history of subarachnoid hemorrhage, aneurysms or AV malformations.   LSN: 10 days ago tPA Given:no- ICHHistory reviewed. No pertinent past medical history.  Past Surgical History  Procedure Date  . Tonsillectomy   . Abdominal hysterectomy     History reviewed. No pertinent family history. Social History:  reports that she has been smoking Cigarettes.  She has a 30 pack-year smoking history. She does not have any smokeless tobacco history on file. She reports that she drinks about 1.8 ounces of alcohol per week. Her drug history not on file.  Allergies: No Known Allergies  Medications Prior to Admission  Medication Sig Dispense Refill  . acetaminophen (TYLENOL) 500 MG tablet Take 500-1,000 mg by mouth every 6 (six) hours as needed. For headache      . aspirin EC 325 MG tablet Take 325 mg by mouth daily.      [name] lisinopril (PRINIVIL,ZESTRIL) 10 MG tablet Take 20 mg by mouth daily.       . naproxen (NAPROSYN) 500 MG tablet Take 500 mg by mouth 2 (two) times daily as needed. For pain      . promethazine (PHENERGAN) 25 MG tablet Take 25 mg by mouth 4 (four) times daily as needed. For nausea      . traMADol (ULTRAM) 50 MG tablet Take 50-100 mg by mouth every 4 (four) hours as needed. For pain        ROS: Positive for nausea, headache and tingling.  Negative for chest pain, palpitations, shortness of breath, diarrhea or fever to  Physical Examination: Blood pressure 138/73, pulse 74, temperature 97.7 F (36.5 C), temperature source Oral, resp. rate 16, height 5\' 3"  (1.6 m), weight 83.5 kg (184 lb 1.4 oz), SpO2 96.00%.  HEENT-  Normocephalic, no lesions, without obvious abnormality.  Normal external eye and conjunctiva.  Normal TM's bilaterally.  Normal auditory canals and external ears. Normal external nose, mucus membranes and septum.  Normal pharynx. Neck supple with no masses, nodes, nodules or enlargement. Cardiovascular - regular rate and rhythm, S1, S2 normal, no murmur, click, rub or gallop Lungs - chest clear, no wheezing, rales, normal symmetric air entry, Heart exam - S1, S2 normal, no murmur, no gallop, rate regular Abdomen - soft, non-tender; bowel sounds normal; no masses,  no organomegaly Extremities - no joint deformities, effusion, or inflammation  Neurologic Examination: Pleasant young Caucasian lady not in distress. Awake alert oriented x3 with normal speech and language function. Intact attention, registration and short-term memory. Good recall. Extraocular moments are full  range without nystagmus. Pupils are equal 3 mm reactive to light. Fundi were not visualized. Vision acuity and fields appear normal. Cough and gag appear normal. Tongue is midline.  Motor system exam reveals no upper or lower expected drift. Equal and symmetric strength in all 4 extremities. No focal weakness. Deep tendon reflexes are 2+ symmetric. Plantars are both downgoing. Touch and  pinprick sensation of preserved. Position and vibration are normal. Gait was not tested. Coordination appears normal.  Results for orders placed during the hospital encounter of 02/27/12 (from the past 48 hour(s))  MRSA PCR SCREENING     Status: Normal   Collection Time   02/27/12  3:26 AM      Component Value Range Comment   MRSA by PCR NEGATIVE  NEGATIVE   GLUCOSE, CAPILLARY     Status: Normal   Collection Time   02/27/12  8:50 AM      Component Value Range Comment   Glucose-Capillary 98  70 - 99 mg/dL   GLUCOSE, CAPILLARY     Status: Abnormal   Collection Time   02/27/12 11:54 AM      Component Value Range Comment   Glucose-Capillary 106 (*) 70 - 99 mg/dL   GLUCOSE, CAPILLARY     Status: Abnormal   Collection Time   02/27/12  4:44 PM      Component Value Range Comment   Glucose-Capillary 105 (*) 70 - 99 mg/dL    Comment 1 Documented in Chart      Comment 2 Notify RN     SEDIMENTATION RATE     Status: Normal   Collection Time   02/27/12  6:54 PM      Component Value Range Comment   Sed Rate 5  0 - 22 mm/hr   C-REACTIVE PROTEIN     Status: Abnormal   Collection Time   02/27/12  6:54 PM      Component Value Range Comment   CRP <0.5 (*) <0.60 mg/dL   GLUCOSE, CAPILLARY     Status: Abnormal   Collection Time   02/27/12  8:38 PM      Component Value Range Comment   Glucose-Capillary 121 (*) 70 - 99 mg/dL    Comment 1 Documented in Chart      Comment 2 Notify RN     GLUCOSE, CAPILLARY     Status: Abnormal   Collection Time   02/27/12  9:59 PM      Component Value Range Comment   Glucose-Capillary 115 (*) 70 - 99 mg/dL    Comment 1 Documented in Chart      Comment 2 Notify RN     GLUCOSE, CAPILLARY     Status: Abnormal   Collection Time   02/28/12  7:33 AM      Component Value Range Comment   Glucose-Capillary 140 (*) 70 - 99 mg/dL   SEDIMENTATION RATE     Status: Normal   Collection Time   02/28/12 10:17 AM      Component Value Range Comment   Sed Rate 3  0 - 22 mm/hr     CBC WITH DIFFERENTIAL     Status: Abnormal   Collection Time   02/28/12 10:17 AM      Component Value Range Comment   WBC 11.5 (*) 4.0 - 10.5 K/uL    RBC 5.06  3.87 - 5.11 MIL/uL    Hemoglobin 15.9 (*) 12.0 - 15.0 g/dL    HCT 40.9 (*) 81.1 - 46.0 %  MCV 91.5  78.0 - 100.0 fL    MCH 31.4  26.0 - 34.0 pg    MCHC 34.3  30.0 - 36.0 g/dL    RDW 16.1  09.6 - 04.5 %    Platelets 225  150 - 400 K/uL    Neutrophils Relative 92 (*) 43 - 77 %    Neutro Abs 10.6 (*) 1.7 - 7.7 K/uL    Lymphocytes Relative 7 (*) 12 - 46 %    Lymphs Abs 0.9  0.7 - 4.0 K/uL    Monocytes Relative 0 (*) 3 - 12 %    Monocytes Absolute 0.1  0.1 - 1.0 K/uL    Eosinophils Relative 0  0 - 5 %    Eosinophils Absolute 0.0  0.0 - 0.7 K/uL    Basophils Relative 0  0 - 1 %    Basophils Absolute 0.0  0.0 - 0.1 K/uL   BASIC METABOLIC PANEL     Status: Abnormal   Collection Time   02/28/12 10:17 AM      Component Value Range Comment   Sodium 139  135 - 145 mEq/L    Potassium 3.5  3.5 - 5.1 mEq/L    Chloride 104  96 - 112 mEq/L    CO2 23  19 - 32 mEq/L    Glucose, Bld 142 (*) 70 - 99 mg/dL    BUN 8  6 - 23 mg/dL    Creatinine, Ser 4.09 (*) 0.50 - 1.10 mg/dL    Calcium 9.2  8.4 - 81.1 mg/dL    GFR calc non Af Amer >90  >90 mL/min    GFR calc Af Amer >90  >90 mL/min   APTT     Status: Normal   Collection Time   02/28/12 10:17 AM      Component Value Range Comment   aPTT 33  24 - 37 seconds   PROTIME-INR     Status: Normal   Collection Time   02/28/12 10:17 AM      Component Value Range Comment   Prothrombin Time 13.0  11.6 - 15.2 seconds    INR 0.99  0.00 - 1.49   GLUCOSE, CAPILLARY     Status: Abnormal   Collection Time   02/28/12 12:16 PM      Component Value Range Comment   Glucose-Capillary 152 (*) 70 - 99 mg/dL    Comment 1 Notify RN      Mr Laqueta Jean Wo Contrast  02/27/2012  *RADIOLOGY REPORT*  Clinical Data:  10-day history of severe headache.  Initial evaluation found  the patient be significantly  hypertensive. Occasional upper and lower extremity parasthesias.  No speech difficulty or history of trauma.  MRI HEAD WITHOUT CONTRAST MRA HEAD WITHOUT CONTRAST  Technique:  Multiplanar, multiecho pulse sequences of the brain and surrounding structures were obtained without intravenous contrast. Angiographic images of the head were obtained using MRA technique without contrast.  Comparison:  Outside CT scan is reported to show right frontal subarachnoid hemorrhage.  MRI HEAD  Findings:  There are two subcentimeter areas of intensely restricted diffusion involving the right frontal cortex and left occipital cortex. Other areas of slight restricted diffusion are seen within the supratentorial cortical sulci, corresponding to prolonged signal on FLAIR imaging, consistent with recent subarachnoid hemorrhage. There is no definite subarachnoid blood identified in the basilar cisterns or the perimesencephalic region.  No atrophy, white matter disease, or hydrocephalus.  No midline shift.  No abnormal post contrast enhancement.  No abnormal flow voids within the cortex or white matter.  Bilateral mastoid fluid is nonspecific.  No acute sinus disease.  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  Could these represent areas of ischemia or infarction secondary to vasospasm?  No visible suprasellar/perimesencephalic subarachnoid hemorrhage or vascular malformation.  MRA HEAD  Findings: Bilateral supraclinoid internal carotid artery tapering is associated with mild diffuse decrease in basilar caliber.  Left vertebral appears normal, with distal right vertebral moderately small.  Severe beading and irregularity of the visualized proximal and distal aspects of the anterior, middle, and posterior cerebral arteries bilaterally.  This appears most severe in the proximal right anterior cerebral and distal left posterior cerebral segment. There is no visible  intracranial aneurysm or vascular malformation. The cerebellar branch vessels are poorly seen.  IMPRESSION: Diffuse irregularity of the intracranial vasculature, most pronounced in the anterior, middle, and posterior cerebral arteries.  Findings consistent with vasospasm secondary subarachnoid hemorrhage. A severe vasculitis could have this appearance, but is nonspecific with regard to the etiologic agent. Correlate clinically.  Formal catheter angiogram and/or lumbar puncture could be helpful in further evaluation.   Original Report Authenticated By: Elsie Stain, M.D.     Assessment: 46 y.o. female 10 day history of progressive severe headaches and subarachnoid hemorrhage bifrontally with MRI showing tiny infarcts in the left occipital and right frontal cortex etiology unclear but strong suspicion for cns vasculitis. Posterior reversible encephalopathy is less likely.No significant vascular risk factors except smoking  Stroke Risk Factors - smoking  Plan: Check ANA panel, ESR, complement levels for vasculitis and four-vessel diagnostic cerebral catheter angiogram to confirm CNS vasculitis. Change IV Decadron to Sorrell Medrol Dosepak for now to treat headaches. If CNS vasculitis is confirmed patient may need long-term steroids later. Check transcranial Dopplers for vasospasm. Check echocardiogram, lipid profile, hemoglobin A1c. Physical and occupational therapy consults. Treat headaches with tramadol and add Topamax 25 twice a day. D/W Dr Jordan Likes and Osa Craver, MD Medical Director [facility] Pager: [phone] 02/28/2012 2:27 PM   02/28/2012, 2:07 PM

## 2012-02-28 NOTE — Progress Notes (Signed)
VASCULAR LAB PRELIMINARY  PRELIMINARY  PRELIMINARY  PRELIMINARY  TCD completed.    Preliminary report:  TCD completed  Jaylise Peek, 02/28/2012, 3:03 PM

## 2012-02-28 NOTE — Progress Notes (Signed)
Overall stable. Patient with headache. No other neurologic complaints however.  She is afebrile. Her vital signs are reasonably stable. Urine output is good. She is awake and alert she is oriented and appropriate. Cranial nerve function is intact. Motor sensory function extremities is normal.  Workup for vasculitis ongoing. Stroke service on board and is evaluating workup. Continue IV steroids and observation. Continue IV fluids.

## 2012-02-28 NOTE — H&P (Signed)
Chief Complaint: Intracranial vasculitis  HPI: Jasmin Cruz is an 46 y.o. female who developed a sudden onset of headache and was found to be significantly hypertensive. Imaging initially showed frontal SAH of uncertain cause, f/u MRI?MRA showed evidence of vasculitis. Workup is still in progress but request is made fro formal cerebral angiogram to assist. Pt otherwise states healthy and not on any routine medications. She does smoke as noted.  Past Medical History: History reviewed. No pertinent past medical history.  Past Surgical History:  Past Surgical History  Procedure Date  . Tonsillectomy   . Abdominal hysterectomy     Family History: History reviewed. Pt reports Rheumatoid in family.  Social History:  reports that she has been smoking Cigarettes.  She has a 30 pack-year smoking history. She does not have any smokeless tobacco history on file. She reports that she drinks about 1.8 ounces of alcohol per week. Her drug history not on file.  Allergies: No Known Allergies  Medications: Medications Prior to Admission  Medication Sig Dispense Refill  . acetaminophen (TYLENOL) 500 MG tablet Take 500-1,000 mg by mouth every 6 (six) hours as needed. For headache      . aspirin EC 325 MG tablet Take 325 mg by mouth daily.      [name] lisinopril (PRINIVIL,ZESTRIL) 10 MG tablet Take 20 mg by mouth daily.      . naproxen (NAPROSYN) 500 MG tablet Take 500 mg by mouth 2 (two) times daily as needed. For pain      . promethazine (PHENERGAN) 25 MG tablet Take 25 mg by mouth 4 (four) times daily as needed. For nausea      . traMADol (ULTRAM) 50 MG tablet Take 50-100 mg by mouth every 4 (four) hours as needed. For pain        Please HPI for pertinent positives, otherwise complete 10 system ROS negative.  Physical Exam: Blood pressure 138/86, pulse 62, temperature 98 F (36.7 C), temperature source Oral, resp. rate 16, height 5\' 3"  (1.6 m), weight 184 lb 1.4 oz (83.5 kg), SpO2 96.00%. Body mass  index is 32.61 kg/(m^2).   General Appearance:  Alert, cooperative, no distress, appears stated age  Head:  Normocephalic, without obvious abnormality, atraumatic  ENT: Unremarkable  Neck: Supple, symmetrical, trachea midline, no adenopathy, thyroid: not enlarged, symmetric, no tenderness/mass/nodules  Lungs:   Clear to auscultation bilaterally, no w/r/r, respirations unlabored without use of accessory muscles.  Heart:  Regular rate and rhythm, S1, S2 normal, no murmur, rub or gallop. Carotids 2+ without bruit.  Abdomen:   Soft, non-tender, non distended. Bowel sounds active all four quadrants,  no masses, no organomegaly.  Extremities: Extremities normal, atraumatic, no cyanosis or edema  Pulses: 2+ and symmetric femoral and pedal pulses  Neurologic: Normal affect, no gross deficits.   Results for orders placed during the hospital encounter of 02/27/12 (from the past 48 hour(s))  MRSA PCR SCREENING     Status: Normal   Collection Time   02/27/12  3:26 AM      Component Value Range Comment   MRSA by PCR NEGATIVE  NEGATIVE   GLUCOSE, CAPILLARY     Status: Normal   Collection Time   02/27/12  8:50 AM      Component Value Range Comment   Glucose-Capillary 98  70 - 99 mg/dL   GLUCOSE, CAPILLARY     Status: Abnormal   Collection Time   02/27/12 11:54 AM      Component Value Range Comment  Glucose-Capillary 106 (*) 70 - 99 mg/dL   GLUCOSE, CAPILLARY     Status: Abnormal   Collection Time   02/27/12  4:44 PM      Component Value Range Comment   Glucose-Capillary 105 (*) 70 - 99 mg/dL    Comment 1 Documented in Chart      Comment 2 Notify RN     SEDIMENTATION RATE     Status: Normal   Collection Time   02/27/12  6:54 PM      Component Value Range Comment   Sed Rate 5  0 - 22 mm/hr   C-REACTIVE PROTEIN     Status: Abnormal   Collection Time   02/27/12  6:54 PM      Component Value Range Comment   CRP <0.5 (*) <0.60 mg/dL   GLUCOSE, CAPILLARY     Status: Abnormal   Collection Time    02/27/12  8:38 PM      Component Value Range Comment   Glucose-Capillary 121 (*) 70 - 99 mg/dL    Comment 1 Documented in Chart      Comment 2 Notify RN     GLUCOSE, CAPILLARY     Status: Abnormal   Collection Time   02/27/12  9:59 PM      Component Value Range Comment   Glucose-Capillary 115 (*) 70 - 99 mg/dL    Comment 1 Documented in Chart      Comment 2 Notify RN     GLUCOSE, CAPILLARY     Status: Abnormal   Collection Time   02/28/12  7:33 AM      Component Value Range Comment   Glucose-Capillary 140 (*) 70 - 99 mg/dL    Mr Laqueta Jean NW Contrast  02/27/2012  *RADIOLOGY REPORT*  Clinical Data:  10-day history of severe headache.  Initial evaluation found  the patient be significantly hypertensive. Occasional upper and lower extremity parasthesias.  No speech difficulty or history of trauma.  MRI HEAD WITHOUT CONTRAST MRA HEAD WITHOUT CONTRAST  Technique:  Multiplanar, multiecho pulse sequences of the brain and surrounding structures were obtained without intravenous contrast. Angiographic images of the head were obtained using MRA technique without contrast.  Comparison:  Outside CT scan is reported to show right frontal subarachnoid hemorrhage.  MRI HEAD  Findings:  There are two subcentimeter areas of intensely restricted diffusion involving the right frontal cortex and left occipital cortex. Other areas of slight restricted diffusion are seen within the supratentorial cortical sulci, corresponding to prolonged signal on FLAIR imaging, consistent with recent subarachnoid hemorrhage. There is no definite subarachnoid blood identified in the basilar cisterns or the perimesencephalic region.  No atrophy, white matter disease, or hydrocephalus.  No midline shift.  No abnormal post contrast enhancement. No abnormal flow voids within the cortex or white matter.  Bilateral mastoid fluid is nonspecific.  No acute sinus disease.  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  Could these represent areas of ischemia or infarction secondary to vasospasm?  No visible suprasellar/perimesencephalic subarachnoid hemorrhage or vascular malformation.  MRA HEAD  Findings: Bilateral supraclinoid internal carotid artery tapering is associated with mild diffuse decrease in basilar caliber.  Left vertebral appears normal, with distal right vertebral moderately small.  Severe beading and irregularity of the visualized proximal and distal aspects of the anterior, middle, and posterior cerebral arteries bilaterally.  This appears most severe in the proximal right anterior cerebral and distal left posterior cerebral  segment. There is no visible intracranial aneurysm or vascular malformation. The cerebellar branch vessels are poorly seen.  IMPRESSION: Diffuse irregularity of the intracranial vasculature, most pronounced in the anterior, middle, and posterior cerebral arteries.  Findings consistent with vasospasm secondary subarachnoid hemorrhage. A severe vasculitis could have this appearance, but is nonspecific with regard to the etiologic agent. Correlate clinically.  Formal catheter angiogram and/or lumbar puncture could be helpful in further evaluation.   Original Report Authenticated By: Elsie Stain, M.D.     Assessment/Plan Intracranial vasculitis Will check labs, coags, Pt has been NPO except coffee early this am. Transcranial dopplers ordered but we may be able to get to A-gram today Dr. Corliss Skains has seen pt and discussed. Consent signed in chart  Brayton El PA-C 02/28/2012, 9:58 AM

## 2012-02-28 NOTE — Progress Notes (Signed)
Pt. C/o of increased weakness/numbness in lower extremities. While lying in the bed strength is a 4/4 is RLE & LLE. Pt. Is having difficulty walking to the bathroom. Pt. Is alert and oriented to person, place, time and situation. PERRLA. NP was made aware. Will continue to monitor. If neurologic condition worsen MD will be made aware.

## 2012-02-29 ENCOUNTER — Inpatient Hospital Stay (HOSPITAL_COMMUNITY): Payer: BC Managed Care – PPO

## 2012-02-29 DIAGNOSIS — I6789 Other cerebrovascular disease: Secondary | ICD-10-CM

## 2012-02-29 LAB — ANA: Anti Nuclear Antibody(ANA): NEGATIVE

## 2012-02-29 LAB — HEMOGLOBIN A1C
Hgb A1c MFr Bld: 5.8 % — ABNORMAL HIGH (ref <5.7)
Mean Plasma Glucose: 120 mg/dL — ABNORMAL HIGH (ref <117)

## 2012-02-29 LAB — GLUCOSE, CAPILLARY: Glucose-Capillary: 106 mg/dL — ABNORMAL HIGH (ref 70–99)

## 2012-02-29 LAB — C3 COMPLEMENT: C3 Complement: 133 mg/dL (ref 90–180)

## 2012-02-29 MED ORDER — HEPARIN SOD (PORK) LOCK FLUSH 100 UNIT/ML IV SOLN
INTRAVENOUS | Status: AC | PRN
  Administered 2012-02-29 (×2): 500 [IU] via INTRAVENOUS

## 2012-02-29 MED ORDER — INFLUENZA VIRUS VACC SPLIT PF IM SUSP
0.5000 mL | INTRAMUSCULAR | Status: AC
  Administered 2012-03-01: 0.5 mL via INTRAMUSCULAR
  Filled 2012-02-29: qty 0.5

## 2012-02-29 MED ORDER — NIMODIPINE 30 MG PO CAPS
60.0000 mg | ORAL_CAPSULE | ORAL | Status: DC
  Administered 2012-02-29 – 2012-03-02 (×12): 60 mg via ORAL
  Filled 2012-02-29 (×17): qty 2

## 2012-02-29 MED ORDER — LISINOPRIL 10 MG PO TABS
10.0000 mg | ORAL_TABLET | Freq: Every day | ORAL | Status: DC
  Filled 2012-02-29: qty 1

## 2012-02-29 MED ORDER — IOHEXOL 300 MG/ML  SOLN
200.0000 mL | Freq: Once | INTRAMUSCULAR | Status: AC | PRN
  Administered 2012-02-29: 80 mL via INTRA_ARTERIAL

## 2012-02-29 MED ORDER — HYDRALAZINE HCL 20 MG/ML IJ SOLN
10.0000 mg | INTRAMUSCULAR | Status: DC | PRN
  Administered 2012-02-29: 10 mg via INTRAVENOUS
  Filled 2012-02-29: qty 1
  Filled 2012-02-29: qty 0.5

## 2012-02-29 MED ORDER — SODIUM CHLORIDE 0.9 % IV SOLN: INTRAVENOUS | Status: AC

## 2012-02-29 MED ORDER — FENTANYL CITRATE 0.05 MG/ML IJ SOLN
INTRAMUSCULAR | Status: AC | PRN
  Administered 2012-02-29: 25 ug via INTRAVENOUS

## 2012-02-29 MED ORDER — LORAZEPAM 1 MG PO TABS: 1.0000 mg | ORAL_TABLET | Freq: Four times a day (QID) | ORAL | Status: DC | PRN

## 2012-02-29 MED ORDER — MIDAZOLAM HCL 5 MG/5ML IJ SOLN
INTRAMUSCULAR | Status: AC | PRN
  Administered 2012-02-29: 1 mg via INTRAVENOUS

## 2012-02-29 MED ORDER — LISINOPRIL 5 MG PO TABS
5.0000 mg | ORAL_TABLET | Freq: Once | ORAL | Status: AC
  Administered 2012-02-29: 5 mg via ORAL
  Filled 2012-02-29: qty 1

## 2012-02-29 MED ORDER — LORAZEPAM 2 MG/ML IJ SOLN
1.0000 mg | Freq: Four times a day (QID) | INTRAMUSCULAR | Status: DC | PRN
  Administered 2012-02-29: 1 mg via INTRAVENOUS
  Filled 2012-02-29: qty 1

## 2012-02-29 NOTE — Progress Notes (Signed)
Dr. Brandon Melnick of the stroke service has agreed to accept patient onto his service for further evaluation of this atypical reactive vascular process. I will remain available for any neurosurgical needs.

## 2012-02-29 NOTE — Progress Notes (Signed)
RN received message from vascular lab regarding TCD results. Advised to notify physician regarding the test results which were posted to the medical record. Dr. Pearlean Brownie was called and made aware of the results, no change in patient condition noted at this time. Will continue to monitor.  [name], RN

## 2012-02-29 NOTE — Procedures (Signed)
S/P 4 vessel cerebral arteriogram. RT CFA approach. Preliminary findings. 1Diffuse extensive focal areas of smooth narrowing in the ant and post circulations. No aneurysms DAVF or art-venous shunting seen

## 2012-02-29 NOTE — Progress Notes (Signed)
VASCULAR LAB PRELIMINARY  PRELIMINARY  PRELIMINARY  PRELIMINARY  Transcranial Doppler  Date POD PCO2 HCT BP  MCA ACA PCA OPHT SIPH VERT Basilar  02/28/12 VS     Right  Left   119  66   -26  -65   71     25  22   29  23    -54  -30   -84      02/29/12 VS     Right  Left   175  145   -32     65     14  12   33     -44  -39   -36           Right  Left                                             Right  Left                                             Right  Left                                            Right  Left                                            Right  Left                                        MCA = Middle Cerebral Artery      OPHT = Opthalmic Artery     BASILAR = Basilar Artery   ACA = Anterior Cerebral Artery     SIPH = Carotid Siphon PCA = Posterior Cerebral Artery   VERT = Verterbral Artery                   Normal MCA = 62+\-12 ACA = 50+\-12 PCA = 42+\-23   02/28/12 Technically difficult especially in the occipital window due to movement. Left PCA not found. 02/29/12 Technically more difficult throughout. Patient experiencing involuntary drifting of the head side to side as if sedated. Several regions not found primarily for this reason. Unable to stay on the windows.       Roselinda Bahena, RVS 02/29/2012, 4:05 PM

## 2012-02-29 NOTE — Progress Notes (Signed)
  Echocardiogram 2D Echocardiogram has been performed.  Jasmin Cruz 02/29/2012, 11:10 AM

## 2012-02-29 NOTE — Progress Notes (Signed)
TCD yesterday shows vasospasm. Will check TCD M-W-F and add nimodipine. Watch BP.  [name], MSN, RN, ANVP-BC, ANP-BC, [facility] Pager: [phone] 02/29/2012 11:12 AM  Scribe for Dr. Delia Heady, Stroke Center Medical Director, who has personally reviewed chart, pertinent data, examined the patient and developed the plan of care. Pager:  [phone]

## 2012-02-29 NOTE — Progress Notes (Signed)
Stroke Team Progress Note  Jasmin Cruz is an 46 y.o. female with a 10 day Jasmin of severe new onset headaches. The symptoms began rather suddenly without precipitating event. Patient initially sought out medical care where she was found to be significant hypertensive. Review of her diary shows SBP 140s to 180s. Headaches have persisted. Patient was started on lisinopril for hypertension 4 days ago. Since that time she's had some intermittent coughing and has had some difficulty with occasional right upper and lower trimming paresthesias. She denies any weakness. She's had no speech difficulty. She denies any Jasmin trauma. She has a prior Jasmin of self-limited severe headaches but this seems different in character to her.she admits to mild nausea and vomiting occasionally but denies light or sound sensitivity. She denies any focal neurological symptoms .[name]  She has no Jasmin of strokes, TIAs or migraines. She does smoke but denies Jasmin of birth control pills. There is no family Jasmin of subarachnoid hemorrhage, aneurysms or AV malformations. She presented 02/27/2012 for evalution. Patient was not a TPA candidate secondary to hemorrhage. She was admitted to the neuro ICU for further evaluation and treatment.  SUBJECTIVE Her sister is at the bedside.  Overall she feels her condition is gradually improving. Both are concerned about current diagnosis and plan of care.  OBJECTIVE Most recent Vital Signs: Filed Vitals:   02/29/12 0400 02/29/12 0500 02/29/12 0600 02/29/12 0700  BP:  105/59 99/51 123/80  Pulse:  72 63 59  Temp: 98.1 F (36.7 C)     TempSrc: Oral     Resp:  14 17 18   Height:      Weight:   81.2 kg (179 lb 0.2 oz)   SpO2:  97% 95% 97%   CBG (last 3)   Basename 02/28/12 2208 02/28/12 1216 02/28/12 0733  GLUCAP 135* 152* 140*   Intake/Output from previous day: 09/24 0701 - 09/25 0700 In: 2402 [I.V.:2400; IV Piggyback:2] Out: 1500 [Urine:1500]  IV Fluid  Intake:     . sodium chloride 100 mL/hr at 02/29/12 0700    MEDICATIONS    . influenza  inactive virus vaccine  0.5 mL Intramuscular Tomorrow-1000  . lisinopril  5 mg Oral Daily  . methylPREDNISolone  4 mg Oral PC lunch  . methylPREDNISolone  4 mg Oral PC supper  . methylPREDNISolone  4 mg Oral 3 x daily with food  . methylPREDNISolone  4 mg Oral 4X daily taper  . methylPREDNISolone  8 mg Oral AC breakfast  . methylPREDNISolone  8 mg Oral Nightly  . methylPREDNISolone  8 mg Oral Nightly  . pantoprazole (PROTONIX) IV  40 mg Intravenous QHS  . senna-docusate  1 tablet Oral BID  . topiramate  25 mg Oral BID  . DISCONTD: dexamethasone  10 mg Intravenous Q6H   PRN:  acetaminophen, acetaminophen, HYDROmorphone (DILAUDID) injection, labetalol, LORazepam, LORazepam, ondansetron (ZOFRAN) IV, traMADol, DISCONTD: labetalol  Diet:  NPO post angio Activity:   Bedrest DVT Prophylaxis:  SCDs   CLINICALLY SIGNIFICANT STUDIES Basic Metabolic Panel:  Lab 02/28/12 1478  NA 139  K 3.5  CL 104  CO2 23  GLUCOSE 142*  BUN 8  CREATININE 0.49*  CALCIUM 9.2  MG --  PHOS --   CBC:  Lab 02/28/12 1017  WBC 11.5*  NEUTROABS 10.6*  HGB 15.9*  HCT 46.3*  MCV 91.5  PLT 225   Coagulation:  Lab 02/28/12 1017  LABPROT 13.0  INR 0.99   Lipid Panel    Component  Value Date/Time   CHOL 186 02/28/2012 1523   TRIG 113 02/28/2012 1523   HDL 30* 02/28/2012 1523   CHOLHDL 6.2 02/28/2012 1523   VLDL 23 02/28/2012 1523   LDLCALC 133* 02/28/2012 1523   HgbA1C  No results found for this basename: HGBA1C   Urine Drug Screen:   No results found for this basename: labopia, cocainscrnur, labbenz, amphetmu, thcu, labbarb    Alcohol Level: No results found for this basename: ETH:2 in the last 168 hours  ANA pending  C3, C4 normal  MRI of the brain  02/27/2012  Acute right frontal and left occipital cortical infarcts, associated with areas of subarachnoid hemorrhage.  Could these represent areas of  ischemia or infarction secondary to vasospasm?  No visible suprasellar/perimesencephalic subarachnoid hemorrhage or vascular malformation.   MRA of the brain  02/27/2012  Diffuse irregularity of the intracranial vasculature, most pronounced in the anterior, middle, and posterior cerebral arteries.  Findings consistent with vasospasm secondary subarachnoid hemorrhage. A severe vasculitis could have this appearance, but is nonspecific with regard to the etiologic agent. Correlate clinically.  Formal catheter angiogram and/or lumbar puncture could be helpful in further evaluation.    Cerebral angio brain S/P 4 vessel cerebral arteriogram.  RT CFA approach. Preliminary findings. Diffuse extensive focal areas of smooth narrowing in the ant and post circulations. No aneurysms DAVF or art-venous shunting seen.  2D Echocardiogram    Transcranial Doppler  Elevated right MCA, PCA and basilar mean flow velocities suggestive of mild vasospasm  Therapy Recommendations PT - outpatient; OT - outpatient  Physical Exam   HEENT- Normocephalic, no lesions, without obvious abnormality. Normal external eye and conjunctiva. Normal TM's bilaterally. Normal auditory canals and external ears. Normal external nose, mucus membranes and septum. Normal pharynx.  Neck supple with no masses, nodes, nodules or enlargement.  Cardiovascular - regular rate and rhythm, S1, S2 normal, no murmur, click, rub or gallop  Lungs - chest clear, no wheezing, rales, normal symmetric air entry, Heart exam - S1, S2 normal, no murmur, no gallop, rate regular  Abdomen - soft, non-tender; bowel sounds normal; no masses, no organomegaly  Extremities - no joint deformities, effusion, or inflammation  Neurologic Examination:  Pleasant young Caucasian lady not in distress. Awake alert oriented x3 with normal speech and language function. Intact attention, registration and short-term memory. Good recall. Extraocular moments are full range without  nystagmus. Pupils are equal 3 mm reactive to light. Fundi were not visualized. Vision acuity and fields appear normal. Cough and gag appear normal. Tongue is midline.  Motor system exam reveals no upper or lower expected drift. Equal and symmetric strength in all 4 extremities. No focal weakness. Deep tendon reflexes are 2+ symmetric. Plantars are both downgoing. Touch and pinprick sensation of preserved. Position and vibration are normal. Gait was not tested. Coordination appears normal.   ASSESSMENT Jasmin Cruz is a 46 y.o. female presenting with a 10 day Jasmin of progressive headaches. Imaging confirms bifrontal SAH  With tiny infarcts in the left occipital and right frontal cortex. Cerebral angio confirms diffuse extensive focal areas of smooth narrowing in the anterior and posterior circulations. Differential: , reversible cerebral vasoconstrictive syndrome vs CALL-Fleming syndrome vs  CNS vasculitis. Work up underway. On aspirin 325 mg orally every day prior to admission. Now on no antiplatelets given hemorrhage. Patient with resultant LE weakness and headache which have improved.   Patient just back from angio this am.    Cigarette smoker Hypertension, SBP 140s HgbA1c  5.8  Hyperlipidemia, LDL 133, not on statin PTA Bradycardia, HR 55  Hospital day # 2  TREATMENT/PLAN  Increase lisinopril to 10 mg daily. SBP goal remains less than 130  F/u ANA  LP under flouro in the future. Current SAH may cloud cell counts  Repeat angio in 3 mos to evaluate for resolution  Ultrasound of renal artery to look for RAS as cause of HTN  Hydralazine for BP control given bradycardia  Change attending to Stroke MD after discussion between Dr. Pearlean Brownie and Dr. Leandra Kern, MSN, RN, ANVP-BC, ANP-BC, GNP-BC [facility] Pager: [phone] 02/29/2012 7:56 AM  Scribe for Dr. Delia Heady, Stroke Center Medical Director, who has personally reviewed chart, pertinent data,  examined the patient and developed the plan of care. Pager:  [phone]

## 2012-03-01 DIAGNOSIS — E785 Hyperlipidemia, unspecified: Secondary | ICD-10-CM | POA: Diagnosis present

## 2012-03-01 DIAGNOSIS — H5340 Unspecified visual field defects: Secondary | ICD-10-CM | POA: Diagnosis present

## 2012-03-01 DIAGNOSIS — I10 Essential (primary) hypertension: Secondary | ICD-10-CM | POA: Diagnosis present

## 2012-03-01 DIAGNOSIS — I67848 Other cerebrovascular vasospasm and vasoconstriction: Secondary | ICD-10-CM | POA: Diagnosis not present

## 2012-03-01 LAB — RAPID URINE DRUG SCREEN, HOSP PERFORMED
Amphetamines: NOT DETECTED
Barbiturates: NOT DETECTED
Benzodiazepines: NOT DETECTED

## 2012-03-01 LAB — GLUCOSE, CAPILLARY: Glucose-Capillary: 107 mg/dL — ABNORMAL HIGH (ref 70–99)

## 2012-03-01 MED ORDER — ATORVASTATIN CALCIUM 10 MG PO TABS
10.0000 mg | ORAL_TABLET | Freq: Every day | ORAL | Status: DC
  Administered 2012-03-01: 10 mg via ORAL
  Filled 2012-03-01 (×2): qty 1

## 2012-03-01 MED ORDER — PANTOPRAZOLE SODIUM 40 MG PO TBEC
40.0000 mg | DELAYED_RELEASE_TABLET | Freq: Every day | ORAL | Status: DC
  Administered 2012-03-01: 40 mg via ORAL
  Filled 2012-03-01: qty 1

## 2012-03-01 MED ORDER — LISINOPRIL 5 MG PO TABS
5.0000 mg | ORAL_TABLET | Freq: Every day | ORAL | Status: DC
  Administered 2012-03-01 – 2012-03-02 (×2): 5 mg via ORAL
  Filled 2012-03-01 (×2): qty 1

## 2012-03-01 NOTE — Progress Notes (Signed)
Stroke Team Progress Note  HISTORY Jasmin Cruz is an 46 y.o. female with a 10 day history of severe new onset headaches. The symptoms began rather suddenly without precipitating event. Patient initially sought out medical care where she was found to be significant hypertensive. Review of her diary shows SBP 140s to 180s. Headaches have persisted. Patient was started on lisinopril for hypertension 4 days ago. Since that time she's had some intermittent coughing and has had some difficulty with occasional right upper and lower trimming paresthesias. She denies any weakness. She's had no speech difficulty. She denies any history trauma. She has a prior history of self-limited severe headaches but this seems different in character to her.she admits to mild nausea and vomiting occasionally but denies light or sound sensitivity. She denies any focal neurological symptoms .[name]  She has no history of strokes, TIAs or migraines. She does smoke but denies history of birth control pills. There is no family history of subarachnoid hemorrhage, aneurysms or AV malformations. She presented 02/27/2012 for evalution. Patient was not a TPA candidate secondary to hemorrhage. She was admitted to the neuro ICU for further evaluation and treatment.  SUBJECTIVE Feeling better, headache improving with Tylenol. Concerned with decreased peripheral vision. therapy recommends OP f/u.  OBJECTIVE Most recent Vital Signs: Filed Vitals:   03/01/12 0500 03/01/12 0600 03/01/12 0700 03/01/12 0800  BP: 105/73 110/69  119/82  Pulse: 59 72    Temp:    97.9 F (36.6 C)  TempSrc:    Oral  Resp: 24 17 19 18   Height:      Weight:      SpO2: 92% 93%     CBG (last 3)   Basename 02/29/12 2244 02/29/12 1749 02/29/12 1112  GLUCAP 115* 106* 132*   Intake/Output from previous day: 09/25 0701 - 09/26 0700 In: 2400 [I.V.:2400] Out: 3250 [Urine:3250]  IV Fluid Intake:     . sodium chloride 100 mL/hr at 03/01/12 0852  . sodium  chloride     MEDICATIONS    . influenza  inactive virus vaccine  0.5 mL Intramuscular Tomorrow-1000  . lisinopril  10 mg Oral Daily  . lisinopril  5 mg Oral Once  . methylPREDNISolone  4 mg Oral 3 x daily with food  . methylPREDNISolone  4 mg Oral 4X daily taper  . methylPREDNISolone  8 mg Oral Nightly  . niMODipine  60 mg Oral Q4H  . pantoprazole (PROTONIX) IV  40 mg Intravenous QHS  . senna-docusate  1 tablet Oral BID  . topiramate  25 mg Oral BID  . DISCONTD: lisinopril  10 mg Oral Daily  . DISCONTD: lisinopril  5 mg Oral Daily   PRN:  acetaminophen, acetaminophen, hydrALAZINE, HYDROmorphone (DILAUDID) injection, labetalol, LORazepam, LORazepam, ondansetron (ZOFRAN) IV, traMADol  Diet:  Cardiac post angio Activity:   Bedrest and Ambulate with assistance DVT Prophylaxis:  SCDs   CLINICALLY SIGNIFICANT STUDIES Basic Metabolic Panel:   Lab 02/28/12 1017  NA 139  K 3.5  CL 104  CO2 23  GLUCOSE 142*  BUN 8  CREATININE 0.49*  CALCIUM 9.2  MG --  PHOS --   CBC:   Lab 02/28/12 1017  WBC 11.5*  NEUTROABS 10.6*  HGB 15.9*  HCT 46.3*  MCV 91.5  PLT 225   Coagulation:   Lab 02/28/12 1017  LABPROT 13.0  INR 0.99   Lipid Panel    Component Value Date/Time   CHOL 186 02/28/2012 1523   TRIG 113 02/28/2012 1523  HDL 30* 02/28/2012 1523   CHOLHDL 6.2 02/28/2012 1523   VLDL 23 02/28/2012 1523   LDLCALC 133* 02/28/2012 1523   HgbA1C  Lab Results  Component Value Date   HGBA1C 5.8* 02/28/2012   Urine Drug Screen:   No results found for this basename: labopia,  cocainscrnur,  labbenz,  amphetmu,  thcu,  labbarb    Alcohol Level: No results found for this basename: ETH:2 in the last 168 hours  C3, C4, ESR, ANA normal  MRI of the brain  02/27/2012  Acute right frontal and left occipital cortical infarcts, associated with areas of subarachnoid hemorrhage.  Could these represent areas of ischemia or infarction secondary to vasospasm?  No visible  suprasellar/perimesencephalic subarachnoid hemorrhage or vascular malformation.   MRA of the brain  02/27/2012  Diffuse irregularity of the intracranial vasculature, most pronounced in the anterior, middle, and posterior cerebral arteries.  Findings consistent with vasospasm secondary subarachnoid hemorrhage. A severe vasculitis could have this appearance, but is nonspecific with regard to the etiologic agent. Correlate clinically.  Formal catheter angiogram and/or lumbar puncture could be helpful in further evaluation.    Cerebral angio brain S/P 4 vessel cerebral arteriogram.  RT CFA approach. Preliminary findings. Diffuse extensive focal areas of smooth narrowing in the ant and post circulations. No aneurysms DAVF or art-venous shunting seen.  2D Echocardiogram  EF 65% with no source of embolus.   Transcranial Doppler   03/01/2012 02/29/2012  02/28/2012  Elevated right MCA, PCA and basilar mean flow velocities suggestive of mild vasospasm  Renal Artery Ultrasound Normal sonographic appearance of the kidneys.  Therapy Recommendations PT - outpatient; OT - outpatient  Physical Exam   HEENT- Normocephalic, no lesions, without obvious abnormality. Normal external eye and conjunctiva. Normal TM's bilaterally. Normal auditory canals and external ears. Normal external nose, mucus membranes and septum. Normal pharynx.  Neck supple with no masses, nodes, nodules or enlargement.  Cardiovascular - regular rate and rhythm, S1, S2 normal, no murmur, click, rub or gallop  Lungs - chest clear, no wheezing, rales, normal symmetric air entry, Heart exam - S1, S2 normal, no murmur, no gallop, rate regular  Abdomen - soft, non-tender; bowel sounds normal; no masses, no organomegaly  Extremities - no joint deformities, effusion, or inflammation  Neurologic Examination:  Pleasant young Caucasian lady not in distress. Awake alert oriented x3 with normal speech and language function. Intact attention, registration  and short-term memory. Good recall. Extraocular moments are full range without nystagmus. Pupils are equal 3 mm reactive to light. Fundi were not visualized. Vision acuity   appear normal but fields show right inferior homonymous hemianopia. Cough and gag appear normal. Tongue is midline.  Motor system exam reveals no upper or lower expected drift. Equal and symmetric strength in all 4 extremities. No focal weakness. Deep tendon reflexes are 2+ symmetric. Plantars are both downgoing. Touch and pinprick sensation of preserved. Position and vibration are normal. Gait was not tested. Coordination appears normal.   ASSESSMENT Jasmin Cruz is a 46 y.o. female presenting with a 10 day history of progressive headaches. Imaging confirms bifrontal SAH  With tiny infarcts in the left occipital and right frontal cortex. Cerebral angio confirms diffuse extensive focal areas of smooth narrowing in the anterior and posterior circulations. Differential: , reversible cerebral vasoconstrictive syndrome vs CALL-Fleming syndrome vs  CNS vasculitis. TCD confirms vasospasm in MCAs, started on nimodipine; concern for etiology of vasospasm. On aspirin 325 mg orally every day prior to admission. Now on  no antiplatelets given hemorrhage. Patient with resultant LE weakness and headache which have improved.    Cigarette smoker Hypertension, SBP 140s. On Lisinopril 10mg  daily. SBP goal remains less than 130 HgbA1c 5.8  Hyperlipidemia, LDL 133, not on statin PTA, on lipitor now Bradycardia, HR 55 etoh abuse, drinks 1 bottle wine/day. CIWA protocol started last night when she became agitated. Received 1 dose of ativan, ok since.  Hospital day # 3  TREATMENT/PLAN  With addition of nimodipine, will decrease lisinopril to 5mg   Add statin for LDL 133  OOB. Continue therapy  Transfer to the floor  LP under flouro in the future. Current SAH may cloud cell counts  Check UDS, cocaine may cause vasospasm  Repeat  angio in 3 mos to evaluate for resolution  No driving  Check TCD today  Anticipate discharge in am  [name], MSN, RN, ANVP-BC, ANP-BC, GNP-BC [facility] Pager: [phone] 03/01/2012 9:00 AM  Scribe for Dr. Delia Heady, Stroke Center Medical Director, who has personally reviewed chart, pertinent data, examined the patient and developed the plan of care. Pager:  [phone]

## 2012-03-01 NOTE — Progress Notes (Signed)
Physical Therapy Treatment Patient Details Name: Jasmin Cruz MRN: [id number] DOB: Nov 16, 1965 Today's Date: 03/01/2012 Time: 6578-4696 PT Time Calculation (min): 25 min  PT Assessment / Plan / Recommendation Comments on Treatment Session  Pt admitted s/p spontaneous SAH and continues to progress with therapy.  Pt able to tolerate increased ambulation distance with increased independence today.  Very motivated.    Follow Up Recommendations  Outpatient PT    Barriers to Discharge        Equipment Recommendations  Tub/shower seat;None recommended by PT    Recommendations for Other Services    Frequency Min 3X/week   Plan Discharge plan remains appropriate;Frequency remains appropriate    Precautions / Restrictions Precautions Precautions: Fall Restrictions Weight Bearing Restrictions: No   Pertinent Vitals/Pain None    Mobility  Bed Mobility Bed Mobility: Not assessed Transfers Transfers: Sit to Stand;Stand to Sit (2 trials.) Sit to Stand: 5: Supervision;With upper extremity assist;From bed;From toilet Stand to Sit: 5: Supervision;With upper extremity assist;To toilet;To chair/3-in-1 Details for Transfer Assistance: Cues for safety. Ambulation/Gait Ambulation/Gait Assistance: 4: Min guard Ambulation Distance (Feet): 330 Feet Assistive device: None Ambulation/Gait Assistance Details: Guarding for balance with cues for safety.  Performed higher level balance activities including head turns (horizontal/vertical), changes in gait speed, and obstacle avoidance with guarding for balance still.  Cues for safety and obstacle avoidance due to blurred vision. Gait Pattern: Step-through pattern Gait velocity: Slowed from normal. Stairs: No Wheelchair Mobility Wheelchair Mobility: No    Exercises     PT Diagnosis:    PT Problem List:   PT Treatment Interventions:     PT Goals Acute Rehab PT Goals PT Goal Formulation: With patient Time For Goal Achievement:  03/06/12 Potential to Achieve Goals: Good PT Goal: Sit to Stand - Progress: Progressing toward goal PT Goal: Stand to Sit - Progress: Progressing toward goal PT Goal: Ambulate - Progress: Progressing toward goal  Visit Information  Last PT Received On: 03/01/12 Assistance Needed: +1    Subjective Data  Subjective: "I feel so much better today." Patient Stated Goal: Go home   Cognition  Overall Cognitive Status: Appears within functional limits for tasks assessed/performed Arousal/Alertness: Awake/alert Orientation Level: Appears intact for tasks assessed Behavior During Session: [facility] for tasks performed    Balance  Balance Balance Assessed: No  End of Session PT - End of Session Equipment Utilized During Treatment: Gait belt Activity Tolerance: Patient tolerated treatment well Patient left: in chair;with call bell/phone within reach Nurse Communication: Mobility status   GP     Cephus Shelling 03/01/2012, 9:32 AM  03/01/2012 Cephus Shelling, PT, DPT [id number]

## 2012-03-02 ENCOUNTER — Telehealth (HOSPITAL_COMMUNITY): Payer: Self-pay | Admitting: *Deleted

## 2012-03-02 LAB — GLUCOSE, CAPILLARY
Glucose-Capillary: 107 mg/dL — ABNORMAL HIGH (ref 70–99)
Glucose-Capillary: 104 mg/dL — ABNORMAL HIGH (ref 70–99)

## 2012-03-02 MED ORDER — TOPIRAMATE 25 MG PO TABS: 25.0000 mg | ORAL_TABLET | Freq: Two times a day (BID) | ORAL | Status: DC

## 2012-03-02 MED ORDER — ATORVASTATIN CALCIUM 10 MG PO TABS: 10.0000 mg | ORAL_TABLET | Freq: Every day | ORAL | Status: AC

## 2012-03-02 MED ORDER — METHYLPREDNISOLONE 4 MG PO KIT: PACK | ORAL | Status: AC

## 2012-03-02 MED ORDER — LISINOPRIL 10 MG PO TABS: 5.0000 mg | ORAL_TABLET | Freq: Every day | ORAL | Status: AC

## 2012-03-02 MED ORDER — NIMODIPINE 30 MG PO CAPS: 60.0000 mg | ORAL_CAPSULE | ORAL | Status: AC

## 2012-03-02 NOTE — Progress Notes (Signed)
Occupational Therapy Treatment Patient Details Name: Jasmin Cruz MRN: [id number] DOB: 1966-03-07 Today's Date: 03/02/2012 Time: 4098-1191 OT Time Calculation (min): 25 min  OT Assessment / Plan / Recommendation Comments on Treatment Session Continue to recommend OPOT for visual field deficit.  Pt reports no dizziness during session today.    Follow Up Recommendations  Outpatient OT    Barriers to Discharge       Equipment Recommendations  Tub/shower seat;None recommended by PT    Recommendations for Other Services    Frequency Min 2X/week   Plan Discharge plan remains appropriate    Precautions / Restrictions Precautions Precautions: Fall   Pertinent Vitals/Pain See vitals    ADL  Eating/Feeding: Performed;Independent Where Assessed - Eating/Feeding: Edge of bed Grooming: Performed;Brushing hair;Independent Where Assessed - Grooming: Unsupported standing Upper Body Dressing: Performed;Independent Where Assessed - Upper Body Dressing: Unsupported standing Toilet Transfer: Simulated;Min guard Toilet Transfer Method: Sit to Barista:  (chair) Equipment Used:  (IV pole for HHA) Transfers/Ambulation Related to ADLs: Min guard for safety while ambulating throughout unit.  ADL Comments: Independently demonstrated good safety awarness of IV line and safely maneuvered around IV line/pole when perofrming functional mobility and turns. Performed skilled visual scanning/tracking tasks while ambulating in hallway.  Pt able to accurately identify all objects in visual fields and able to maneuver throughout environment without difficulty.  Pt reports seeing a "blank" spot in right eye.  Tested pt's vision further and noted right lateral mid visual field deficit (right eye only).  Educated pt on safety awareness at home and in community due to vision deficit.  Also recommended to pt to get cleared by MD before driving. Pt verbalized understanding.    OT  Diagnosis:    OT Problem List:   OT Treatment Interventions:     OT Goals ADL Goals Pt Will Transfer to Toilet: with supervision;Comfort height toilet ADL Goal: Toilet Transfer - Progress: Progressing toward goals Miscellaneous OT Goals Miscellaneous OT Goal #1: pt will complete visual scanning task, incorporating multistep problem solving skills without vc.  OT Goal: Miscellaneous Goal #1 - Progress: Progressing toward goals  Visit Information  Last OT Received On: 03/02/12 Assistance Needed: +1    Subjective Data      Prior Functioning       Cognition  Overall Cognitive Status: Appears within functional limits for tasks assessed/performed Arousal/Alertness: Awake/alert Orientation Level: Appears intact for tasks assessed Behavior During Session: [facility] for tasks performed    Mobility  Shoulder Instructions Bed Mobility Bed Mobility: Not assessed Transfers Sit to Stand: 5: Supervision;From bed Stand to Sit: 5: Supervision;To chair/3-in-1 Details for Transfer Assistance: supervision for safety       Exercises      Balance     End of Session OT - End of Session Equipment Utilized During Treatment: Gait belt Activity Tolerance: Patient tolerated treatment well Patient left: in chair;with call bell/phone within reach;with family/visitor present  GO   03/02/2012 Cipriano Mile OTR/L Pager [phone] Office [phone]   Cipriano Mile 03/02/2012, 1:04 PM

## 2012-03-02 NOTE — Progress Notes (Signed)
Pt was unable to get nimotop filled at any nearby pharmacy. Press photographer and RN called multiple pharmacies as well.  topamax needed prior authorization before pharmacy could fill. MD had already left for today. Called md on call to get prescriptions for med. MD supplied weekend doses to hold pt. Till Monday.  MD gave verbal order to supply doses for tonight for both drugs. Pt under no s/s distress currently.

## 2012-03-02 NOTE — Discharge Summary (Signed)
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  Hyperlipidemia LDL goal < 100  Vasospasm of cerebral artery  Visual field cut  Hypertension  Cigarette smoker  Marijuana use  Bradycardia  Obesity class 1, BMI: Body mass index is 31.71 kg/(m^2).   History reviewed. No pertinent past medical history. Past Surgical History  Procedure Date  . Tonsillectomy   . Abdominal hysterectomy       Medication List     As of 03/02/2012  2:57 PM    STOP taking these medications         aspirin EC 325 MG tablet      naproxen 500 MG tablet   Commonly known as: NAPROSYN      TAKE these medications         acetaminophen 500 MG tablet   Commonly known as: TYLENOL   Take 500-1,000 mg by mouth every 6 (six) hours as needed. For headache      atorvastatin 10 MG tablet   Commonly known as: LIPITOR   Take 1 tablet (10 mg total) by mouth daily at 6 PM.      lisinopril 10 MG tablet   Commonly known as: PRINIVIL,ZESTRIL   Take 0.5 tablets (5 mg total) by mouth daily.      methylPREDNISolone 4 MG tablet   Commonly known as: MEDROL DOSEPAK   Send inpatient dose pack home with patient      niMODipine 30 MG capsule   Commonly known as: NIMOTOP   Take 2 capsules (60 mg total) by mouth every 4 (four) hours.      promethazine 25 MG tablet   Commonly known as: PHENERGAN   Take 25 mg by mouth 4  (four) times daily as needed. For nausea      topiramate 25 MG tablet   Commonly known as: TOPAMAX   Take 1 tablet (25 mg total) by mouth 2 (two) times daily.      traMADol 50 MG tablet   Commonly known as: ULTRAM   Take 50-100 mg by mouth every 4 (four) hours as needed. For pain         LABORATORY STUDIES CBC    Component Value Date/Time   WBC 11.5* 02/28/2012 1017   RBC 5.06 02/28/2012 1017   HGB 15.9* 02/28/2012 1017   HCT 46.3* 02/28/2012 1017   PLT 225 02/28/2012 1017   MCV 91.5 02/28/2012 1017   MCH 31.4 02/28/2012 1017   MCHC 34.3 02/28/2012 1017   RDW 12.9 02/28/2012 1017   LYMPHSABS 0.9 02/28/2012 1017   MONOABS 0.1 02/28/2012 1017   EOSABS 0.0 02/28/2012 1017   BASOSABS 0.0 02/28/2012 1017   CMP    Component Value Date/Time   NA 139 02/28/2012 1017   K 3.5 02/28/2012 1017   CL 104 02/28/2012 1017   CO2 23 02/28/2012 1017   GLUCOSE 142* 02/28/2012 1017   BUN 8 02/28/2012 1017  CREATININE 0.49* 02/28/2012 1017   CALCIUM 9.2 02/28/2012 1017   PROT 7.3 01/28/2009 0945   ALBUMIN 4.1 01/28/2009 0945   AST 24 01/28/2009 0945   ALT 27 01/28/2009 0945   ALKPHOS 60 01/28/2009 0945   BILITOT 0.8 01/28/2009 0945   GFRNONAA >90 02/28/2012 1017   GFRAA >90 02/28/2012 1017   COAGS Lab Results  Component Value Date   INR 0.99 02/28/2012   Lipid Panel    Component Value Date/Time   CHOL 186 02/28/2012 1523   TRIG 113 02/28/2012 1523   HDL 30* 02/28/2012 1523   CHOLHDL 6.2 02/28/2012 1523   VLDL 23 02/28/2012 1523   LDLCALC 133* 02/28/2012 1523   HgbA1C  Lab Results  Component Value Date   HGBA1C 5.8* 02/28/2012   Urine Drug Screen     Component Value Date/Time   LABOPIA NONE DETECTED 03/01/2012 1107   COCAINSCRNUR NONE DETECTED 03/01/2012 1107   LABBENZ NONE DETECTED 03/01/2012 1107   AMPHETMU NONE DETECTED 03/01/2012 1107   THCU POSITIVE* 03/01/2012 1107   LABBARB NONE DETECTED 03/01/2012 1107    Alcohol Level No results found for this basename: eth  C3, C4, ESR, ANA  normal  SIGNIFICANT DIAGNOSTIC STUDIES MRI of the brain 02/27/2012 Acute right frontal and left occipital cortical infarcts, associated with areas of subarachnoid hemorrhage. Could these represent areas of ischemia or infarction secondary to vasospasm? No visible suprasellar/perimesencephalic subarachnoid hemorrhage or vascular malformation.  MRA of the brain 02/27/2012 Diffuse irregularity of the intracranial vasculature, most pronounced in the anterior, middle, and posterior cerebral arteries. Findings consistent with vasospasm secondary subarachnoid hemorrhage. A severe vasculitis could have this appearance, but is nonspecific with regard to the etiologic agent. Correlate clinically. Formal catheter angiogram and/or lumbar puncture could be helpful in further evaluation.  Cerebral angio brain S/P 4 vessel cerebral arteriogram.  RT CFA approach. Preliminary findings. Diffuse extensive focal areas of smooth narrowing in the ant and post circulations. No aneurysms DAVF or art-venous shunting seen.  2D Echocardiogram EF 65% with no source of embolus.  Transcranial Doppler  03/01/2012 2 elevated, the rest lowering  02/29/2012 Increased mean flow velocities in both middle cerebral arteries suggestive of increased moderate vasospasm. Improved right posterior cerebral and basilar vasopsasm compared with TCD 02/28/12.  02/28/2012 Elevated right MCA, PCA and basilar mean flow velocities suggestive of mild vasospasm  Renal Artery Ultrasound Normal sonographic appearance of the kidneys. Transcranial Doppleler Date  POD  PCO2  HCT  BP   MCA  ACA  PCA  OPHT  SIPH  VERT  Basilar   02/28/12 VS      Right  119  -26  71  25  29  -54  -84        Left  66  -65   22  23  -30    02/29/12 VS      Right  175  -32  65  14  33  -44  -36        Left  145    12   -39    03-02-12  hc      Right  144  -42   18  102  -39  -56        Left  131  -40  26  22  38  -50     History of Present Illness Jasmin Cruz is an 46 y.o.  female with a 10 day history of severe new onset headaches. The symptoms began rather  suddenly without precipitating event. Patient initially sought out medical care where she was found to be significant hypertensive. Review of her diary shows SBP 140s to 180s. Headaches have persisted. Patient was started on lisinopril for hypertension 4 days ago. Since that time she's had some intermittent coughing and has had some difficulty with occasional right upper and lower trimming paresthesias. She denies any weakness. She's had no speech difficulty. She denies any history trauma. She has a prior history of self-limited severe headaches but this seems different in character to her.she admits to mild nausea and vomiting occasionally but denies light or sound sensitivity. She denies any focal neurological symptoms. She has no history of strokes, TIAs or migraines. She does smoke but denies history of birth control pills. There is no family history of subarachnoid hemorrhage, aneurysms or AV malformations. She presented 02/27/2012 for evalution. Patient was not a TPA candidate secondary to hemorrhage. She was admitted to the neuro ICU for further evaluation and treatment.  Hospital Course Imaging confirms bifrontal SAH With tiny infarcts in the left occipital and right frontal cortex. Cerebral angio confirms diffuse extensive focal areas of smooth narrowing in the anterior and posterior circulations. 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 Recommend follow angio is 3 mos to see if vasoconstriction has resolved. This will help pinpoint diagnosis. LP under flouro in the future; current SAH may cloud cell counts.  Patient was on aspirin 325 mg orally every day prior to admission. Now on no antiplatelets given hemorrhage.  TCD confirms vasospasm in MCAs, started on nimodipine. MCA velocities slowly dropping. Will schedule TCD for OP follow up on Monday,  Sept 30.  Cigarette smoker, marijuana smoker. Counseled to stop. Counseled to limit etoh use. Did become restless in hospital and started on CIWA protocol.  Hypertension, SBP 140s. On Lisinopril 10mg  daily. Lisinopril decreased to 5 mg with the addition of nimodipine. May need to adjust BP medications on nimodipine is stopped.  Hyperlipidemia, LDL 133, not on statin PTA. Lipitor added.  Patient with continued stroke symptoms of LE weakness, field cut and headache, which are improving. Physical therapy, occupational therapy and speech therapy evaluated patient. They recommend outpatient PT follow up, which was arranged. No driving due to field cut.  Discharge Exam  Blood pressure 143/86, pulse 67, temperature 97.5 F (36.4 C), temperature source Oral, resp. rate 20, height 5\' 3"  (1.6 m), weight 81.2 kg (179 lb 0.2 oz), SpO2 99.00%. HEENT- Normocephalic, no lesions, without obvious abnormality. Normal external eye and conjunctiva. Normal TM's bilaterally. Normal auditory canals and external ears. Normal external nose, mucus membranes and septum. Normal pharynx.  Neck supple with no masses, nodes, nodules or enlargement.  Cardiovascular - regular rate and rhythm, S1, S2 normal, no murmur, click, rub or gallop  Lungs - chest clear, no wheezing, rales, normal symmetric air entry, Heart exam - S1, S2 normal, no murmur, no gallop, rate regular  Abdomen - soft, non-tender; bowel sounds normal; no masses, no organomegaly  Extremities - no joint deformities, effusion, or inflammation  Neurologic Examination:  Pleasant young Caucasian lady not in distress. Awake alert oriented x3 with normal speech and language function. Intact attention, registration and short-term memory. Good recall. Extraocular moments are full range without nystagmus. Pupils are equal 3 mm reactive to light. Fundi were not visualized. Vision acuity appear normal but fields show right inferior homonymous hemianopia. Cough and gag appear  normal. Tongue is midline.  Motor system exam reveals no upper or lower  expected drift. Equal and symmetric strength in all 4 extremities. No focal weakness. Deep tendon reflexes are 2+ symmetric. Plantars are both downgoing. Touch and pinprick sensation of preserved. Position and vibration are normal. Gait was not tested. Coordination appears normal.   Discharge Diet   Cardiac thin liquids  Discharge Plan    Disposition:  home with family  No aspirin or aspirin containing products  Stop marijuana  Nimodipine x 21 days  Follow up LFTs, new lipitor this admission  Outpatient PT   LP under flouro in the future. Current SAH may cloud cell counts   Cerebral angio in 3 months  No driving due to field cut   Ongoing risk factor control by Primary Care Physician. Risk factor recommendations: goal LDL < 100, goal SBP < 130 Outpatient TCD at [facility], Monday, September 30 at 11a  Follow-up HEPLER,MARK, PA in 1 month.  Follow-up with Dr. Delia Heady in 1 month.  Signed [name], AVNP, ANP-BC, [facility] Nurse Practitioner 03/02/2012, 2:57 PM  Dr. Delia Heady, Stroke Center Medical Director, has personally reviewed chart, pertinent data, examined the patient and developed the plan of care.

## 2012-03-02 NOTE — Progress Notes (Signed)
Stroke Team Progress Note  HISTORY Jasmin Cruz is an 46 y.o. female with a 10 day history of severe new onset headaches. The symptoms began rather suddenly without precipitating event. Patient initially sought out medical care where she was found to be significant hypertensive. Review of her diary shows SBP 140s to 180s. Headaches have persisted. Patient was started on lisinopril for hypertension 4 days ago. Since that time she's had some intermittent coughing and has had some difficulty with occasional right upper and lower trimming paresthesias. She denies any weakness. She's had no speech difficulty. She denies any history trauma. She has a prior history of self-limited severe headaches but this seems different in character to her.she admits to mild nausea and vomiting occasionally but denies light or sound sensitivity. She denies any focal neurological symptoms .[name]  She has no history of strokes, TIAs or migraines. She does smoke but denies history of birth control pills. There is no family history of subarachnoid hemorrhage, aneurysms or AV malformations. She presented 02/27/2012 for evalution. Patient was not a TPA candidate secondary to hemorrhage. She was admitted to the neuro ICU for further evaluation and treatment.  SUBJECTIVE Woke with a mild headache. Improves after 2 tylenol each morning. Overall, feeling better.Rt leg numbness and inferior visual field loss persists OBJECTIVE Most recent Vital Signs: Filed Vitals:   03/01/12 1800 03/01/12 2147 03/02/12 0234 03/02/12 0533  BP: 142/105 116/66 126/79 140/85  Pulse:  68 55 62  Temp:  98.2 F (36.8 C) 97.8 F (36.6 C) 97.6 F (36.4 C)  TempSrc:  Oral Oral Oral  Resp: 17 18 20 20   Height:      Weight:      SpO2:  96% 99% 98%   CBG (last 3)   Basename 03/01/12 2130 03/01/12 1202 03/01/12 0841  GLUCAP 107* 134* 107*   Intake/Output from previous day: 09/26 0701 - 09/27 0700 In: 2000 [I.V.:2000] Out: -   IV Fluid Intake:        . sodium chloride 100 mL/hr at 03/02/12 0600   MEDICATIONS     . atorvastatin  10 mg Oral q1800  . influenza  inactive virus vaccine  0.5 mL Intramuscular Tomorrow-1000  . lisinopril  5 mg Oral Daily  . methylPREDNISolone  4 mg Oral 4X daily taper  . niMODipine  60 mg Oral Q4H  . pantoprazole  40 mg Oral QHS  . senna-docusate  1 tablet Oral BID  . topiramate  25 mg Oral BID  . DISCONTD: pantoprazole (PROTONIX) IV  40 mg Intravenous QHS   PRN:  acetaminophen, acetaminophen, hydrALAZINE, HYDROmorphone (DILAUDID) injection, labetalol, LORazepam, LORazepam, ondansetron (ZOFRAN) IV, traMADol  Diet:  Cardiac thin liquids Activity:   Ambulate with assistance DVT Prophylaxis:  SCDs   CLINICALLY SIGNIFICANT STUDIES Basic Metabolic Panel:   Lab 02/28/12 1017  NA 139  K 3.5  CL 104  CO2 23  GLUCOSE 142*  BUN 8  CREATININE 0.49*  CALCIUM 9.2  MG --  PHOS --   CBC:   Lab 02/28/12 1017  WBC 11.5*  NEUTROABS 10.6*  HGB 15.9*  HCT 46.3*  MCV 91.5  PLT 225   Coagulation:   Lab 02/28/12 1017  LABPROT 13.0  INR 0.99   Lipid Panel    Component Value Date/Time   CHOL 186 02/28/2012 1523   TRIG 113 02/28/2012 1523   HDL 30* 02/28/2012 1523   CHOLHDL 6.2 02/28/2012 1523   VLDL 23 02/28/2012 1523   LDLCALC 133* 02/28/2012 1523  HgbA1C  Lab Results  Component Value Date   HGBA1C 5.8* 02/28/2012   Drugs of Abuse     Component Value Date/Time   LABOPIA NONE DETECTED 03/01/2012 1107   COCAINSCRNUR NONE DETECTED 03/01/2012 1107   LABBENZ NONE DETECTED 03/01/2012 1107   AMPHETMU NONE DETECTED 03/01/2012 1107   THCU POSITIVE* 03/01/2012 1107   LABBARB NONE DETECTED 03/01/2012 1107    Alcohol Level: No results found for this basename: ETH:2 in the last 168 hours  C3, C4, ESR, ANA normal  MRI of the brain  02/27/2012  Acute right frontal and left occipital cortical infarcts, associated with areas of subarachnoid hemorrhage.  Could these represent areas of ischemia or  infarction secondary to vasospasm?  No visible suprasellar/perimesencephalic subarachnoid hemorrhage or vascular malformation.   MRA of the brain  02/27/2012  Diffuse irregularity of the intracranial vasculature, most pronounced in the anterior, middle, and posterior cerebral arteries.  Findings consistent with vasospasm secondary subarachnoid hemorrhage. A severe vasculitis could have this appearance, but is nonspecific with regard to the etiologic agent. Correlate clinically.  Formal catheter angiogram and/or lumbar puncture could be helpful in further evaluation.    Cerebral angio brain S/P 4 vessel cerebral arteriogram.  RT CFA approach. Preliminary findings. Diffuse extensive focal areas of smooth narrowing in the ant and post circulations. No aneurysms DAVF or art-venous shunting seen.  2D Echocardiogram  EF 65% with no source of embolus.   Transcranial Doppler   03/01/2012 2 elevated, the rest lowering 02/29/2012 Increased mean flow velocities in both middle cerebral arteries suggestive of increased moderate vasospasm. Improved right posterior cerebral and basilar vasopsasm compared with TCD 02/28/12.  02/28/2012  Elevated right MCA, PCA and basilar mean flow velocities suggestive of mild vasospasm  Renal Artery Ultrasound Normal sonographic appearance of the kidneys.  Therapy Recommendations PT - outpatient; OT - outpatient  Physical Exam   HEENT- Normocephalic, no lesions, without obvious abnormality. Normal external eye and conjunctiva. Normal TM's bilaterally. Normal auditory canals and external ears. Normal external nose, mucus membranes and septum. Normal pharynx.  Neck supple with no masses, nodes, nodules or enlargement.  Cardiovascular - regular rate and rhythm, S1, S2 normal, no murmur, click, rub or gallop  Lungs - chest clear, no wheezing, rales, normal symmetric air entry, Heart exam - S1, S2 normal, no murmur, no gallop, rate regular  Abdomen - soft, non-tender; bowel sounds  normal; no masses, no organomegaly  Extremities - no joint deformities, effusion, or inflammation  Neurologic Examination:  Pleasant young Caucasian lady not in distress. Awake alert oriented x3 with normal speech and language function. Intact attention, registration and short-term memory. Good recall. Extraocular moments are full range without nystagmus. Pupils are equal 3 mm reactive to light. Fundi were not visualized. Vision acuity   appear normal but fields show right inferior homonymous hemianopia. Cough and gag appear normal. Tongue is midline.  Motor system exam reveals no upper or lower expected drift. Equal and symmetric strength in all 4 extremities. No focal weakness. Deep tendon reflexes are 2+ symmetric. Plantars are both downgoing. Touch and pinprick sensation of preserved. Position and vibration are normal. Gait was not tested. Coordination appears normal.   ASSESSMENT Ms. LAINE FONNER is a 46 y.o. female presenting with a 10 day history of progressive headaches. Imaging confirms bifrontal SAH  With tiny infarcts in the left occipital and right frontal cortex. Cerebral angio confirms diffuse extensive focal areas of smooth narrowing in the anterior and posterior circulations. Differential: , reversible  cerebral vasoconstrictive syndrome vs CALL-Fleming syndrome vs  CNS vasculitis. TCD confirms vasospasm in MCAs, started on nimodipine; concern for etiology of vasospasm. On aspirin 325 mg orally every day prior to admission. Now on no antiplatelets given hemorrhage. Patient with resultant LE weakness and headache which have improved.    Cigarette smoker Hypertension, SBP 140s. On Lisinopril 10mg  daily. SBP goal remains less than 130 HgbA1c 5.8  Hyperlipidemia, LDL 133, not on statin PTA, on lipitor now Bradycardia, HR 55 THC use etoh abuse, drinks 1 bottle wine/day. CIWA protocol started last night when she became agitated. Received 1 dose of ativan, ok since.  Hospital day #  4  TREATMENT/PLAN  TCD today. If velocities continue to drop, will discharge home. If remains elevated, will keep one more day.  Transfer to the floor  LP under flouro in the future. Current SAH may cloud cell counts  Stop THC use. Dr. Pearlean Brownie counseled.  Repeat angio in 3 mos to evaluate for resolution  No driving due to field cut  D/W patient and mom  [name], MSN, RN, ANVP-BC, ANP-BC, GNP-BC [facility] Pager: [phone] 03/02/2012 9:59 AM  Scribe for Dr. Delia Heady, Stroke Center Medical Director, who has personally reviewed chart, pertinent data, examined the patient and developed the plan of care. Pager:  [phone]

## 2012-03-02 NOTE — Care Management Note (Signed)
    Page 1 of 1   03/02/2012     4:05:19 PM   CARE MANAGEMENT NOTE 03/02/2012  Patient:  Jasmin Cruz, Jasmin Cruz   Account Number:  [id number]  Date Initiated:  02/27/2012  Documentation initiated by:  [facility]  Subjective/Objective Assessment:   Admitted with headache, subarchnoid hemorrhage     Action/Plan:   PT/OT evals   Anticipated DC Date:  03/01/2012   Anticipated DC Plan:  HOME W HOME HEALTH SERVICES      DC Planning Services  CM consult      Choice offered to / List presented to:             Status of service:  Completed, signed off Medicare Important Message given?   (If response is "NO", the following Medicare IM given date fields will be blank) Date Medicare IM given:   Date Additional Medicare IM given:    Discharge Disposition:  HOME/SELF CARE  Per UR Regulation:  Reviewed for med. necessity/level of care/duration of stay  If discussed at Long Length of Stay Meetings, dates discussed:    Comments:  03/02/12 Onnie Boer, RN, BSN 1601 PT WAS ADMITTED WITH SAH.  PTA PT LIVED ALONE.  PT WILL BE RETURNING TO HOME WITH HER SISTER/ FAMILY AND FRIENDS.  PT HAS BEEN INFORMED OF OP PT/OT [facility].  PT CAN NOT DRIVE AND SHOULD TAKE IT EASY UNTIL INSTRUCTED OTHER WISE.  PT HAS FOLLOW UP APPTS THAT HAVE BEEN ARRANGED AND HER SCRIPTS HAVE BEEN SENT TO THE [facility] [place].

## 2012-03-02 NOTE — Progress Notes (Signed)
VASCULAR LAB PRELIMINARY  PRELIMINARY  PRELIMINARY  PRELIMINARY  Transcranial Doppler  Date POD PCO2 HCT BP  MCA ACA PCA OPHT SIPH VERT Basilar  02/28/12 VS     Right  Left   119  66   -26  -65   71     25  22   29  23    -54  -30   -84      02/29/12 VS     Right  Left   175  145   -32     65     14  12   33     -44  -39   -36      03-02-12 hc     Right  Left   144  131   -42  -40     26   18  22    102  38   -39  -50   -56            Right  Left                                             Right  Left                                            Right  Left                                            Right  Left                                        MCA = Middle Cerebral Artery      OPHT = Opthalmic Artery     BASILAR = Basilar Artery   ACA = Anterior Cerebral Artery     SIPH = Carotid Siphon PCA = Posterior Cerebral Artery   VERT = Verterbral Artery                   Normal MCA = 62+\-12 ACA = 50+\-12 PCA = 42+\-23   02/28/12 Technically difficult especially in the occipital window due to movement. Left PCA not found. VS 02/29/12 Technically more difficult throughout. Patient experiencing involuntary drifting of the head side to side as if sedated. Several regions not found primarily for this reason. Unable to stay on the windows. VS   Jayanth Szczesniak, RVT 03/02/2012, 11:36 AM

## 2012-03-02 NOTE — Progress Notes (Signed)
Dc instructions provided. Pt verbalized understanding. Pt prescriptions sent to pharmacy. Pt under no s/s distress.

## 2012-03-05 ENCOUNTER — Ambulatory Visit (HOSPITAL_COMMUNITY)
Admission: EM | Admit: 2012-03-05 | Discharge: 2012-03-05 | Disposition: A | Discharge status: Home / Self Care | Payer: BC Managed Care – PPO | Source: Other Acute Inpatient Hospital | Attending: Neurology | Admitting: Neurology

## 2012-03-05 DIAGNOSIS — I609 Nontraumatic subarachnoid hemorrhage, unspecified: Secondary | ICD-10-CM

## 2012-03-05 DIAGNOSIS — I67848 Other cerebrovascular vasospasm and vasoconstriction: Secondary | ICD-10-CM

## 2012-03-05 DIAGNOSIS — R51 Headache: Secondary | ICD-10-CM

## 2012-03-05 NOTE — Progress Notes (Signed)
VASCULAR LAB PRELIMINARY  PRELIMINARY  PRELIMINARY  PRELIMINARY  Transcranial Doppler  Date POD PCO2 HCT BP  MCA ACA PCA OPHT SIPH VERT Basilar  outpatient 03/05/12 VS     Right  Left    99  26   -62  -28   60  27   22  18   24  25    -42  -44   -63           Right  Left                                            Right  Left                                             Right  Left                                             Right  Left                                            Right  Left                                            Right  Left                                        MCA = Middle Cerebral Artery      OPHT = Opthalmic Artery     BASILAR = Basilar Artery   ACA = Anterior Cerebral Artery     SIPH = Carotid Siphon PCA = Posterior Cerebral Artery   VERT = Verterbral Artery                   Normal MCA = 62+\-12 ACA = 50+\-12 PCA = 42+\-23         Jasmin Cruz, 03/05/2012, 11:56 AM

## 2012-07-18 ENCOUNTER — Encounter: Payer: Self-pay | Admitting: Neurology

## 2012-12-13 ENCOUNTER — Other Ambulatory Visit (HOSPITAL_BASED_OUTPATIENT_CLINIC_OR_DEPARTMENT_OTHER): Payer: Self-pay | Admitting: Gynecology

## 2012-12-13 DIAGNOSIS — Z1231 Encounter for screening mammogram for malignant neoplasm of breast: Secondary | ICD-10-CM

## 2013-01-16 ENCOUNTER — Ambulatory Visit (HOSPITAL_BASED_OUTPATIENT_CLINIC_OR_DEPARTMENT_OTHER)
Admission: RE | Admit: 2013-01-16 | Discharge: 2013-01-16 | Disposition: A | Discharge status: Home / Self Care | Payer: BC Managed Care – PPO | Source: Ambulatory Visit | Attending: Gynecology | Admitting: Gynecology

## 2013-01-16 DIAGNOSIS — Z1231 Encounter for screening mammogram for malignant neoplasm of breast: Secondary | ICD-10-CM

## 2014-01-16 ENCOUNTER — Other Ambulatory Visit (HOSPITAL_BASED_OUTPATIENT_CLINIC_OR_DEPARTMENT_OTHER): Payer: Self-pay | Admitting: Gynecology

## 2014-01-16 DIAGNOSIS — Z1231 Encounter for screening mammogram for malignant neoplasm of breast: Secondary | ICD-10-CM

## 2014-01-22 ENCOUNTER — Ambulatory Visit (HOSPITAL_BASED_OUTPATIENT_CLINIC_OR_DEPARTMENT_OTHER)
Admission: RE | Admit: 2014-01-22 | Discharge: 2014-01-22 | Disposition: A | Discharge status: Home / Self Care | Payer: BC Managed Care – PPO | Source: Ambulatory Visit | Attending: Gynecology | Admitting: Gynecology

## 2014-01-22 DIAGNOSIS — Z1231 Encounter for screening mammogram for malignant neoplasm of breast: Secondary | ICD-10-CM | POA: Diagnosis not present

## 2014-03-12 ENCOUNTER — Other Ambulatory Visit: Payer: Self-pay | Admitting: Gynecology

## 2014-03-14 LAB — CYTOLOGY - PAP

## 2015-01-22 ENCOUNTER — Other Ambulatory Visit (HOSPITAL_BASED_OUTPATIENT_CLINIC_OR_DEPARTMENT_OTHER): Payer: Self-pay | Admitting: Gynecology

## 2015-01-22 DIAGNOSIS — Z1231 Encounter for screening mammogram for malignant neoplasm of breast: Secondary | ICD-10-CM

## 2015-01-26 ENCOUNTER — Ambulatory Visit (HOSPITAL_BASED_OUTPATIENT_CLINIC_OR_DEPARTMENT_OTHER)
Admission: RE | Admit: 2015-01-26 | Discharge: 2015-01-26 | Disposition: A | Discharge status: Home / Self Care | Payer: BLUE CROSS/BLUE SHIELD | Source: Ambulatory Visit | Attending: Gynecology | Admitting: Gynecology

## 2015-01-26 DIAGNOSIS — Z1231 Encounter for screening mammogram for malignant neoplasm of breast: Secondary | ICD-10-CM

## 2015-05-27 ENCOUNTER — Ambulatory Visit (INDEPENDENT_AMBULATORY_CARE_PROVIDER_SITE_OTHER): Payer: BLUE CROSS/BLUE SHIELD | Admitting: Obstetrics & Gynecology

## 2015-05-27 ENCOUNTER — Encounter: Payer: Self-pay | Admitting: Obstetrics & Gynecology

## 2015-05-27 VITALS — Ht 63.5 in | Wt 207.0 lb

## 2015-05-27 DIAGNOSIS — Z01419 Encounter for gynecological examination (general) (routine) without abnormal findings: Secondary | ICD-10-CM | POA: Diagnosis not present

## 2015-05-27 DIAGNOSIS — Z90710 Acquired absence of both cervix and uterus: Secondary | ICD-10-CM | POA: Diagnosis not present

## 2015-05-27 DIAGNOSIS — Z0000 Encounter for general adult medical examination without abnormal findings: Secondary | ICD-10-CM

## 2015-05-27 NOTE — Progress Notes (Signed)
Subjective:    Jasmin Cruz is a 49 y.o. SW P0 female who presents for an annual exam. The patient has no complaints today. The patient is sexually active. GYN screening history: last pap: was normal. The patient wears seatbelts: yes. The patient participates in regular exercise: no. Has the patient ever been transfused or tattooed?: yes. (tattoo)  The patient reports that there is not domestic violence in her life.   Menstrual History: OB History    No data available      Menarche age: [age]  No LMP recorded. Patient has had a hysterectomy.    The following portions of the patient's history were reviewed and updated as appropriate: allergies, current medications, past family history, past medical history, past social history, past surgical history and problem list.  Review of Systems Pertinent items are noted in HPI. Had her flu vaccine, mammo utd. Gets fasting labs through work. Works at [employer]. Monogamous for 2 years, denies dyspareunia. 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, only thigh itching   Objective:    Ht 5' 3.5" (1.613 m)  Wt 207 lb (93.895 kg)  BMI 36.09 kg/m2  General Appearance:    Alert, cooperative, no distress, appears stated age  Head:    Normocephalic, without obvious abnormality, atraumatic  Eyes:    PERRL, conjunctiva/corneas clear, EOM's intact, fundi    benign, both eyes  Ears:    Normal TM's and external ear canals, both ears  Nose:   Nares normal, septum midline, mucosa normal, no drainage    or sinus tenderness  Throat:   Lips, mucosa, and tongue normal; teeth and gums normal  Neck:   Supple, symmetrical, trachea midline, no adenopathy;    thyroid:  no enlargement/tenderness/nodules; no carotid   bruit or JVD  Back:     Symmetric, no curvature, ROM normal, no CVA tenderness  Lungs:     Clear to auscultation bilaterally, respirations unlabored  Chest Wall:    No tenderness or deformity   Heart:    Regular rate and rhythm,  S1 and S2 normal, no murmur, rub   or gallop  Breast Exam:    No tenderness, masses, or nipple abnormality  Abdomen:     Soft, non-tender, bowel sounds active all four quadrants,    no masses, no organomegaly  Genitalia:    Normal female without lesion, discharge or tenderness, normal cuff with good support, no masses palpable with bimanual exam     Extremities:   Extremities normal, atraumatic, no cyanosis or edema  Pulses:   2+ and symmetric all extremities  Skin:   Skin color, texture, turgor normal, no rashes or lesions  Lymph nodes:   Cervical, supraclavicular, and axillary nodes normal  Neurologic:   CNII-XII intact, normal strength, sensation and reflexes    throughout  .    Assessment:    Healthy female exam.    Plan:     Breast self exam technique reviewed and patient encouraged to perform self-exam monthly. Mammogram.   Rec colon cancer screening

## 2015-10-05 ENCOUNTER — Encounter: Payer: Self-pay | Admitting: Obstetrics & Gynecology

## 2015-10-05 ENCOUNTER — Ambulatory Visit (INDEPENDENT_AMBULATORY_CARE_PROVIDER_SITE_OTHER): Payer: BLUE CROSS/BLUE SHIELD | Admitting: Obstetrics & Gynecology

## 2015-10-05 VITALS — BP 124/65 | HR 84 | Ht 63.5 in | Wt 204.0 lb

## 2015-10-05 DIAGNOSIS — N898 Other specified noninflammatory disorders of vagina: Secondary | ICD-10-CM | POA: Diagnosis not present

## 2015-10-05 DIAGNOSIS — N958 Other specified menopausal and perimenopausal disorders: Secondary | ICD-10-CM | POA: Diagnosis not present

## 2015-10-05 DIAGNOSIS — E8941 Symptomatic postprocedural ovarian failure: Secondary | ICD-10-CM

## 2015-10-05 MED ORDER — SERTRALINE HCL 50 MG PO TABS: 50.0000 mg | ORAL_TABLET | Freq: Every day | ORAL | Status: DC

## 2015-10-05 NOTE — Progress Notes (Signed)
Patient states that she has had itching and burning for approx. Two weeks. Armandina StammerJennifer Howard RN BSN

## 2015-10-05 NOTE — Progress Notes (Signed)
Patient ID: Jasmin Cruz, female   DOB: 1965/06/25, 50 y.o.   MRN: [id number] History:  50 y.o. No obstetric history on file. here today for c/o itching vaginally for 2 weeks. She denies discharge or odor.  She denies change in the products used on her vulva.  She denies h/o DM.  She does report hot flushes that she has noted for the past 1 year that seem tot be getting worse.   She reports that the hot flushes interfere with her daily life and she would like to explore tx options.   The following portions of the patient's history were reviewed and updated as appropriate: allergies, current medications, past family history, past medical history, past social history, past surgical history and problem list.  Past Medical History  Diagnosis Date  . Hypertension   . Stroke (HCC)   . High cholesterol    Current Outpatient Prescriptions on File Prior to Visit  Medication Sig Dispense Refill  . acetaminophen (TYLENOL) 500 MG tablet Take 500-1,000 mg by mouth every 6 (six) hours as needed. For headache    . atorvastatin (LIPITOR) 10 MG tablet Take 1 tablet (10 mg total) by mouth daily at 6 PM. 30 tablet 2  . lisinopril (PRINIVIL,ZESTRIL) 10 MG tablet Take 0.5 tablets (5 mg total) by mouth daily. 30 tablet 2  . methylPREDNISolone (MEDROL DOSEPAK) 4 MG tablet Send inpatient dose pack home with patient 21 tablet 0  . promethazine (PHENERGAN) 25 MG tablet Take 25 mg by mouth 4 (four) times daily as needed. Reported on 05/27/2015    . simvastatin (ZOCOR) 40 MG tablet TAKE 1 TABLET IN THE EVENING ONCE A DAY ORALLY 30  11  . topiramate (TOPAMAX) 25 MG tablet Take 1 tablet (25 mg total) by mouth 2 (two) times daily. 60 tablet 2  . traMADol (ULTRAM) 50 MG tablet Take 50-100 mg by mouth every 4 (four) hours as needed. Reported on 05/27/2015    . valACYclovir (VALTREX) 500 MG tablet Take 500 mg by mouth 2 (two) times daily.    [name]. niMODipine (NIMOTOP) 30 MG capsule Take 2 capsules (60 mg total) by mouth every  4 (four) hours. 230 capsule 0   No current facility-administered medications on file prior to visit.  Pt is also on Sertraline 25 mg daily    Review of Systems:  Pertinent items are noted in HPI.  Objective:  Physical Exam Height 5' 3.5" (1.613 m), weight 204 lb (92.534 kg). Gen: NAD Pelvic: Normal appearing external genitalia; normal appearing vaginal mucosa and cervix.  Normal discharge.  Small uterus, no other palpable masses, no uterine or adnexal tenderness   Assessment & Plan:  Vaginal itching- need to r/o yeast.  Wet smear and KOH obtained. Vasomotor sx related to menopause  Pt is NOT a  Candidate for EES due to h/o CVA.  She is already on Zoloft 25mg  daily for depression.  Rec increased dosage to 50 mg daily to see if vaso motor sx are improved.  rec f/u in 3 month or sooner prn  Jasmin Cruz, M.D., [employer]n [employer]

## 2015-10-05 NOTE — Patient Instructions (Addendum)
Vaginitis Vaginitis is an inflammation of the vagina. It is most often caused by a change in the normal balance of the bacteria and yeast that live in the vagina. This change in balance causes an overgrowth of certain bacteria or yeast, which causes the inflammation. There are different types of vaginitis, but the most common types are:  Bacterial vaginosis.  Yeast infection (candidiasis).  Trichomoniasis vaginitis. This is a sexually transmitted infection (STI).  Viral vaginitis.  Atrophic vaginitis.  Allergic vaginitis. CAUSES  The cause depends on the type of vaginitis. Vaginitis can be caused by:  Bacteria (bacterial vaginosis).  Yeast (yeast infection).  A parasite (trichomoniasis vaginitis)  A virus (viral vaginitis).  Low hormone levels (atrophic vaginitis). Low hormone levels can occur during pregnancy, breastfeeding, or after menopause.  Irritants, such as bubble baths, scented tampons, and feminine sprays (allergic vaginitis). Other factors can change the normal balance of the yeast and bacteria that live in the vagina. These include:  Antibiotic medicines.  Poor hygiene.  Diaphragms, vaginal sponges, spermicides, birth control pills, and intrauterine devices (IUD).  Sexual intercourse.  Infection.  Uncontrolled diabetes.  A weakened immune system. SYMPTOMS  Symptoms can vary depending on the cause of the vaginitis. Common symptoms include:  Abnormal vaginal discharge.  The discharge is white, gray, or yellow with bacterial vaginosis.  The discharge is thick, white, and cheesy with a yeast infection.  The discharge is frothy and yellow or greenish with trichomoniasis.  A bad vaginal odor.  The odor is fishy with bacterial vaginosis.  Vaginal itching, pain, or swelling.  Painful intercourse.  Pain or burning when urinating. Sometimes, there are no symptoms. TREATMENT  Treatment will vary depending on the type of infection.   Bacterial  vaginosis and trichomoniasis are often treated with antibiotic creams or pills.  Yeast infections are often treated with antifungal medicines, such as vaginal creams or suppositories.  Viral vaginitis has no cure, but symptoms can be treated with medicines that relieve discomfort. Your sexual partner should be treated as well.  Atrophic vaginitis may be treated with an estrogen cream, pill, suppository, or vaginal ring. If vaginal dryness occurs, lubricants and moisturizing creams may help. You may be told to avoid scented soaps, sprays, or douches.  Allergic vaginitis treatment involves quitting the use of the product that is causing the problem. Vaginal creams can be used to treat the symptoms. HOME CARE INSTRUCTIONS   Take all medicines as directed by your caregiver.  Keep your genital area clean and dry. Avoid soap and only rinse the area with water.  Avoid douching. It can remove the healthy bacteria in the vagina.  Do not use tampons or have sexual intercourse until your vaginitis has been treated. Use sanitary pads while you have vaginitis.  Wipe from front to back. This avoids the spread of bacteria from the rectum to the vagina.  Let air reach your genital area.  Wear cotton underwear to decrease moisture buildup.  Avoid wearing underwear while you sleep until your vaginitis is gone.  Avoid tight pants and underwear or nylons without a cotton panel.  Take off wet clothing (especially bathing suits) as soon as possible.  Use mild, non-scented products. Avoid using irritants, such as:  Scented feminine sprays.  Fabric softeners.  Scented detergents.  Scented tampons.  Scented soaps or bubble baths.  Practice safe sex and use condoms. Condoms may prevent the spread of trichomoniasis and viral vaginitis. SEEK MEDICAL CARE IF:   You have abdominal pain.  You   have a fever or persistent symptoms for more than 2-3 days.  You have a fever and your symptoms suddenly  get worse.   This information is not intended to replace advice given to you by your health care provider. Make sure you discuss any questions you have with your health care provider.   Document Released: 03/20/2007 Document Revised: 10/07/2014 Document Reviewed: 11/03/2011 Elsevier Interactive Patient Education 2016 Elsevier Inc.  Menopause Menopause is the normal time of life when menstrual periods stop completely. Menopause is complete when you have missed 12 consecutive menstrual periods. It usually occurs between the ages of 48 years and 55 years. Very rarely does a woman develop menopause before the age of 40 years. At menopause, your ovaries stop producing the female hormones estrogen and progesterone. This can cause undesirable symptoms and also affect your health. Sometimes the symptoms may occur 4-5 years before the menopause begins. There is no relationship between menopause and:  Oral contraceptives.  Number of children you had.  Race.  The age your menstrual periods started (menarche). Heavy smokers and very thin women may develop menopause earlier in life. CAUSES  The ovaries stop producing the female hormones estrogen and progesterone.  Other causes include:  Surgery to remove both ovaries.  The ovaries stop functioning for no known reason.  Tumors of the pituitary gland in the brain.  Medical disease that affects the ovaries and hormone production.  Radiation treatment to the abdomen or pelvis.  Chemotherapy that affects the ovaries. SYMPTOMS   Hot flashes.  Night sweats.  Decrease in sex drive.  Vaginal dryness and thinning of the vagina causing painful intercourse.  Dryness of the skin and developing wrinkles.  Headaches.  Tiredness.  Irritability.  Memory problems.  Weight gain.  Bladder infections.  Hair growth of the face and chest.  Infertility. More serious symptoms include:  Loss of bone (osteoporosis) causing breaks  (fractures).  Depression.  Hardening and narrowing of the arteries (atherosclerosis) causing heart attacks and strokes. DIAGNOSIS   When the menstrual periods have stopped for 12 straight months.  Physical exam.  Hormone studies of the blood. TREATMENT  There are many treatment choices and nearly as many questions about them. The decisions to treat or not to treat menopausal changes is an individual choice made with your health care provider. Your health care provider can discuss the treatments with you. Together, you can decide which treatment will work best for you. Your treatment choices may include:   Hormone therapy (estrogen and progesterone).  Non-hormonal medicines.  Treating the individual symptoms with medicine (for example antidepressants for depression).  Herbal medicines that may help specific symptoms.  Counseling by a psychiatrist or psychologist.  Group therapy.  Lifestyle changes including:  Eating healthy.  Regular exercise.  Limiting caffeine and alcohol.  Stress management and meditation.  No treatment. HOME CARE INSTRUCTIONS   Take the medicine your health care provider gives you as directed.  Get plenty of sleep and rest.  Exercise regularly.  Eat a diet that contains calcium (good for the bones) and soy products (acts like estrogen hormone).  Avoid alcoholic beverages.  Do not smoke.  If you have hot flashes, dress in layers.  Take supplements, calcium, and vitamin D to strengthen bones.  You can use over-the-counter lubricants or moisturizers for vaginal dryness.  Group therapy is sometimes very helpful.  Acupuncture may be helpful in some cases. SEEK MEDICAL CARE IF:   You are not sure you are in menopause.  You  are having menopausal symptoms and need advice and treatment.  You are still having menstrual periods after age 52 years.  You have pain with intercourse.  Menopause is complete (no menstrual period for 12 months)  and you develop vaginal bleeding.  You need a referral to a specialist (gynecologist, psychiatrist, or psychologist) for treatment. SEEK IMMEDIATE MEDICAL CARE IF:   You have severe depression.  You have excessive vaginal bleeding.  You fell and think you have a broken bone.  You have pain when you urinate.  You develop leg or chest pain.  You have a fast pounding heart beat (palpitations).  You have severe headaches.  You develop vision problems.  You feel a lump in your breast.  You have abdominal pain or severe indigestion.   This information is not intended to replace advice given to you by your health care provider. Make sure you discuss any questions you have with your health care provider.   Document Released: 08/13/2003 Document Revised: 01/23/2013 Document Reviewed: 12/20/2012 Elsevier Interactive Patient Education [employer]2016 Elsevier Inc.

## 2015-10-06 ENCOUNTER — Telehealth: Payer: Self-pay

## 2015-10-06 LAB — WET PREP BY MOLECULAR PROBE
CANDIDA SPECIES: NEGATIVE
Gardnerella vaginalis: NEGATIVE
TRICHOMONAS VAG: POSITIVE — AB

## 2015-10-06 MED ORDER — METRONIDAZOLE 500 MG PO TABS: 500.0000 mg | ORAL_TABLET | Freq: Two times a day (BID) | ORAL | Status: AC

## 2015-10-06 NOTE — Telephone Encounter (Signed)
Patient called for results of wet prep. Patient made aware that her wet prep showed trichomonas infection and made patient aware that she will need antibiotic to treat this. Patient pharmacy verified and rx sent. Patient made aware to avoid alcohol during taking this medication. Patient also made aware that she need to refrain from intercourse until her partner is tested and treated. Patient states understanding. Armandina StammerJennifer Anothony Bursch RN BSN

## 2015-10-07 ENCOUNTER — Other Ambulatory Visit: Payer: Self-pay | Admitting: Obstetrics & Gynecology

## 2015-10-07 DIAGNOSIS — A599 Trichomoniasis, unspecified: Secondary | ICD-10-CM

## 2015-10-07 MED ORDER — METRONIDAZOLE 500 MG PO TABS
ORAL_TABLET | ORAL | Status: AC
Start: 2015-10-07 — End: ?

## 2015-12-24 ENCOUNTER — Ambulatory Visit (INDEPENDENT_AMBULATORY_CARE_PROVIDER_SITE_OTHER): Payer: BLUE CROSS/BLUE SHIELD | Admitting: Obstetrics & Gynecology

## 2015-12-24 ENCOUNTER — Encounter: Payer: Self-pay | Admitting: Obstetrics & Gynecology

## 2015-12-24 VITALS — BP 115/71 | HR 90 | Temp 98.1°F | Ht 63.5 in | Wt 205.0 lb

## 2015-12-24 DIAGNOSIS — L989 Disorder of the skin and subcutaneous tissue, unspecified: Secondary | ICD-10-CM

## 2015-12-24 DIAGNOSIS — Z1231 Encounter for screening mammogram for malignant neoplasm of breast: Secondary | ICD-10-CM

## 2015-12-24 DIAGNOSIS — L03313 Cellulitis of chest wall: Secondary | ICD-10-CM | POA: Diagnosis not present

## 2015-12-24 MED ORDER — SULFAMETHOXAZOLE-TRIMETHOPRIM 800-160 MG PO TABS: 1.0000 | ORAL_TABLET | Freq: Two times a day (BID) | ORAL | Status: AC

## 2015-12-24 NOTE — Progress Notes (Signed)
   CLINIC ENCOUNTER NOTE  History:  50 y.o. F here today for evaluation of skin lesion in between breasts that is painful and started expressing pus since yesterday after she applied warm compresses.  No other symptoms, denies fevers or other skin lesions.  Lesion makes it hard to wear bras. She denies any abnormal vaginal discharge, bleeding, pelvic pain or other concerns.   Past Medical History  Diagnosis Date  . Hypertension   . Stroke (HCC)   . High cholesterol     Past Surgical History  Procedure Laterality Date  . Tonsillectomy    . Abdominal hysterectomy    . Leep     The following portions of the patient's history were reviewed and updated as appropriate: allergies, current medications, past family history, past medical history, past social history, past surgical history and problem list.   Health Maintenance:  Normal pap on 03/12/2014.  Normal mammogram on 01/29/2015.   Review of Systems:  Pertinent items noted in HPI and remainder of comprehensive ROS otherwise negative.  Objective:  Physical Exam BP 115/71 mmHg  Pulse 90  Temp(Src) 98.1 F (36.7 C) (Oral)  Ht 5' 3.5" (1.613 m)  Wt 205 lb (92.987 kg)  BMI 35.74 kg/m2 CONSTITUTIONAL: Well-developed, well-nourished female in no acute distress.  CHEST WALL:  2 x 3 cm lesion on chest wall, in middle of chest. Indurated, +blanching erythema, purulent drainage expressed. Normal breast, no concerning breast lesions. CARDIOVASCULAR: Normal heart rate noted RESPIRATORY: Effort and breath sounds normal, no problems with respiration noted ABDOMEN: Soft, no distention noted.   PELVIC: Deferred MUSCULOSKELETAL: Normal range of motion. No edema noted.  Labs and Imaging No results found.  Assessment & Plan:  1. Skin lesion of chest wall 2. Cellulitis of chest wall Lesion is consistent with a skin boil/abscess that is draining but still appears infected. Bactrim DS prescribed. Patient told to continue warm compresses, return  for worsening symptoms.  - sulfamethoxazole-trimethoprim (BACTRIM DS,SEPTRA DS) 800-160 MG tablet; Take 1 tablet by mouth 2 (two) times daily.  Dispense: 14 tablet; Refill: 1  3. Visit for screening mammogram Regular screening mammogram ordered - MM DIGITAL SCREENING BILATERAL; Future  Routine preventative health maintenance measures emphasized. Please refer to After Visit Summary for other counseling recommendations.   Total face-to-face time with patient: 15 minutes. Over 50% of encounter was spent on counseling and coordination of care.   Jaynie CollinsUGONNA  Sanjeev Main, MD, FACOG Attending Obstetrician & Gynecologist, [facility], [facility]

## 2015-12-24 NOTE — Patient Instructions (Signed)
Return to clinic for any scheduled appointments or for any gynecologic concerns as needed.   

## 2015-12-25 ENCOUNTER — Encounter: Payer: Self-pay | Admitting: Obstetrics & Gynecology

## 2016-02-03 ENCOUNTER — Other Ambulatory Visit: Payer: Self-pay | Admitting: Obstetrics & Gynecology

## 2016-02-03 DIAGNOSIS — Z1231 Encounter for screening mammogram for malignant neoplasm of breast: Secondary | ICD-10-CM

## 2016-02-04 ENCOUNTER — Ambulatory Visit (HOSPITAL_BASED_OUTPATIENT_CLINIC_OR_DEPARTMENT_OTHER)
Admission: RE | Admit: 2016-02-04 | Discharge: 2016-02-04 | Disposition: A | Discharge status: Home / Self Care | Payer: BLUE CROSS/BLUE SHIELD | Source: Ambulatory Visit | Attending: Obstetrics & Gynecology | Admitting: Obstetrics & Gynecology

## 2016-02-04 DIAGNOSIS — Z1231 Encounter for screening mammogram for malignant neoplasm of breast: Secondary | ICD-10-CM

## 2017-01-17 ENCOUNTER — Telehealth: Payer: Self-pay

## 2017-01-17 NOTE — Telephone Encounter (Signed)
Patient requesting a refill on sertraline 50 mg for hot flashes. Patient last annual 10-05-15 but patient states that she was told she didn't need an annual until 10-2017. Will route to provider for approval of refill. Armandina StammerJennifer Howard RNBSN

## 2017-01-20 ENCOUNTER — Telehealth: Payer: Self-pay

## 2017-01-20 DIAGNOSIS — E8941 Symptomatic postprocedural ovarian failure: Secondary | ICD-10-CM

## 2017-01-20 MED ORDER — SERTRALINE HCL 50 MG PO TABS: 50.0000 mg | ORAL_TABLET | Freq: Every day | ORAL | 1 refills | Status: DC

## 2017-01-20 NOTE — Telephone Encounter (Signed)
Patient requesting refill on Serterline (for hot flashes). Per Dr. Erin Fulling may refill until her appointment.Appointment schedule for 02-15-17 and refill sent.  Armandina Stammer RNBSN

## 2017-02-15 ENCOUNTER — Encounter: Payer: Self-pay | Admitting: Obstetrics & Gynecology

## 2017-02-15 ENCOUNTER — Ambulatory Visit (INDEPENDENT_AMBULATORY_CARE_PROVIDER_SITE_OTHER): Payer: BLUE CROSS/BLUE SHIELD | Admitting: Obstetrics & Gynecology

## 2017-02-15 VITALS — Ht 63.5 in | Wt 211.0 lb

## 2017-02-15 DIAGNOSIS — G43009 Migraine without aura, not intractable, without status migrainosus: Secondary | ICD-10-CM | POA: Diagnosis not present

## 2017-02-15 DIAGNOSIS — N951 Menopausal and female climacteric states: Secondary | ICD-10-CM

## 2017-02-15 DIAGNOSIS — Z1239 Encounter for other screening for malignant neoplasm of breast: Secondary | ICD-10-CM

## 2017-02-15 MED ORDER — SERTRALINE HCL 100 MG PO TABS: 100.0000 mg | ORAL_TABLET | Freq: Every day | ORAL | 6 refills | Status: AC

## 2017-02-15 MED ORDER — TOPIRAMATE 25 MG PO TABS: 25.0000 mg | ORAL_TABLET | Freq: Two times a day (BID) | ORAL | 2 refills | Status: AC

## 2017-02-15 NOTE — Progress Notes (Signed)
History:  51 y.o. G1P0010 here today for reeval of vasomotor sx. Pt reports relief of sx on Zoloft but, she ran out and her sx returned and now she's been on the meds for 3 weeks and the sx have not improved.     The following portions of the patient's history were reviewed and updated as appropriate: allergies, current medications, past family history, past medical history, past social history, past surgical history and problem list.  Review of Systems:  Pertinent items are noted in HPI.   Objective:  Physical Exam Height 5' 3.5" (1.613 m), weight 211 lb (95.7 kg).  CONSTITUTIONAL: Well-developed, well-nourished female in no acute distress.  HENT:  Normocephalic, atraumatic EYES: Conjunctivae and EOM are normal. No scleral icterus.  NECK: Normal range of motion SKIN: Skin is warm and dry. No rash noted. Not diaphoretic.No pallor. NEUROLGIC: Alert and oriented to person, place, and time. Normal coordination.    Assessment & Plan:  Menopausal hot flushes  Zoloft increase to 100mg  daily  Screening mammogran Refill Topamax 25 mg bid RF x2 Rec f/u in 6 month or sooner prn   Total face-to-face time with patient was 15 min.  Greater than 50% was spent in counseling and coordination of care with the patient.   Tor Tsuda L. Harraway-Smith, M.D., Evern CoreFACOG

## 2017-02-15 NOTE — Patient Instructions (Signed)
Menopause Menopause is the normal time of life when menstrual periods stop completely. Menopause is complete when you have missed 12 consecutive menstrual periods. It usually occurs between the ages of 48 years and 55 years. Very rarely does a woman develop menopause before the age of 40 years. At menopause, your ovaries stop producing the female hormones estrogen and progesterone. This can cause undesirable symptoms and also affect your health. Sometimes the symptoms may occur 4-5 years before the menopause begins. There is no relationship between menopause and:  Oral contraceptives.  Number of children you had.  Race.  The age your menstrual periods started (menarche).  Heavy smokers and very thin women may develop menopause earlier in life. What are the causes?  The ovaries stop producing the female hormones estrogen and progesterone. Other causes include:  Surgery to remove both ovaries.  The ovaries stop functioning for no known reason.  Tumors of the pituitary gland in the brain.  Medical disease that affects the ovaries and hormone production.  Radiation treatment to the abdomen or pelvis.  Chemotherapy that affects the ovaries.  What are the signs or symptoms?  Hot flashes.  Night sweats.  Decrease in sex drive.  Vaginal dryness and thinning of the vagina causing painful intercourse.  Dryness of the skin and developing wrinkles.  Headaches.  Tiredness.  Irritability.  Memory problems.  Weight gain.  Bladder infections.  Hair growth of the face and chest.  Infertility. More serious symptoms include:  Loss of bone (osteoporosis) causing breaks (fractures).  Depression.  Hardening and narrowing of the arteries (atherosclerosis) causing heart attacks and strokes.  How is this diagnosed?  When the menstrual periods have stopped for 12 straight months.  Physical exam.  Hormone studies of the blood. How is this treated? There are many treatment  choices and nearly as many questions about them. The decisions to treat or not to treat menopausal changes is an individual choice made with your health care provider. Your health care provider can discuss the treatments with you. Together, you can decide which treatment will work best for you. Your treatment choices may include:  Hormone therapy (estrogen and progesterone).  Non-hormonal medicines.  Treating the individual symptoms with medicine (for example antidepressants for depression).  Herbal medicines that may help specific symptoms.  Counseling by a psychiatrist or psychologist.  Group therapy.  Lifestyle changes including: ? Eating healthy. ? Regular exercise. ? Limiting caffeine and alcohol. ? Stress management and meditation.  No treatment.  Follow these instructions at home:  Take the medicine your health care provider gives you as directed.  Get plenty of sleep and rest.  Exercise regularly.  Eat a diet that contains calcium (good for the bones) and soy products (acts like estrogen hormone).  Avoid alcoholic beverages.  Do not smoke.  If you have hot flashes, dress in layers.  Take supplements, calcium, and vitamin D to strengthen bones.  You can use over-the-counter lubricants or moisturizers for vaginal dryness.  Group therapy is sometimes very helpful.  Acupuncture may be helpful in some cases. Contact a health care provider if:  You are not sure you are in menopause.  You are having menopausal symptoms and need advice and treatment.  You are still having menstrual periods after age 55 years.  You have pain with intercourse.  Menopause is complete (no menstrual period for 12 months) and you develop vaginal bleeding.  You need a referral to a specialist (gynecologist, psychiatrist, or psychologist) for treatment. Get help right   away if:  You have severe depression.  You have excessive vaginal bleeding.  You fell and think you have a  broken bone.  You have pain when you urinate.  You develop leg or chest pain.  You have a fast pounding heart beat (palpitations).  You have severe headaches.  You develop vision problems.  You feel a lump in your breast.  You have abdominal pain or severe indigestion. This information is not intended to replace advice given to you by your health care provider. Make sure you discuss any questions you have with your health care provider. Document Released: 08/13/2003 Document Revised: 10/29/2015 Document Reviewed: 12/20/2012 Elsevier Interactive Patient Education  2017 Elsevier Inc.  

## 2017-02-23 ENCOUNTER — Ambulatory Visit (HOSPITAL_BASED_OUTPATIENT_CLINIC_OR_DEPARTMENT_OTHER): Payer: BLUE CROSS/BLUE SHIELD

## 2017-03-02 ENCOUNTER — Ambulatory Visit (HOSPITAL_BASED_OUTPATIENT_CLINIC_OR_DEPARTMENT_OTHER)
Admission: RE | Admit: 2017-03-02 | Discharge: 2017-03-02 | Disposition: A | Discharge status: Home / Self Care | Payer: BLUE CROSS/BLUE SHIELD | Source: Ambulatory Visit | Attending: Obstetrics & Gynecology | Admitting: Obstetrics & Gynecology

## 2017-03-02 DIAGNOSIS — Z1231 Encounter for screening mammogram for malignant neoplasm of breast: Secondary | ICD-10-CM | POA: Insufficient documentation

## 2017-03-02 DIAGNOSIS — Z1239 Encounter for other screening for malignant neoplasm of breast: Secondary | ICD-10-CM

## 2018-03-05 ENCOUNTER — Other Ambulatory Visit: Payer: Self-pay | Admitting: Obstetrics & Gynecology

## 2018-03-05 DIAGNOSIS — Z1231 Encounter for screening mammogram for malignant neoplasm of breast: Secondary | ICD-10-CM

## 2018-03-09 ENCOUNTER — Ambulatory Visit (HOSPITAL_BASED_OUTPATIENT_CLINIC_OR_DEPARTMENT_OTHER)
Admission: RE | Admit: 2018-03-09 | Discharge: 2018-03-09 | Disposition: A | Discharge status: Home / Self Care | Payer: BLUE CROSS/BLUE SHIELD | Source: Ambulatory Visit | Attending: Obstetrics & Gynecology | Admitting: Obstetrics & Gynecology

## 2018-03-09 DIAGNOSIS — Z1231 Encounter for screening mammogram for malignant neoplasm of breast: Secondary | ICD-10-CM | POA: Insufficient documentation

## 2019-03-09 IMAGING — MG DIGITAL SCREENING BILATERAL MAMMOGRAM WITH TOMO AND CAD
8 series · 8 of 24 positions shown · non-contrast
Comparison: Previous exam(s).

CLINICAL DATA: Screening.

EXAM:
DIGITAL SCREENING BILATERAL MAMMOGRAM WITH TOMO AND CAD

[R MLO synth-2D]
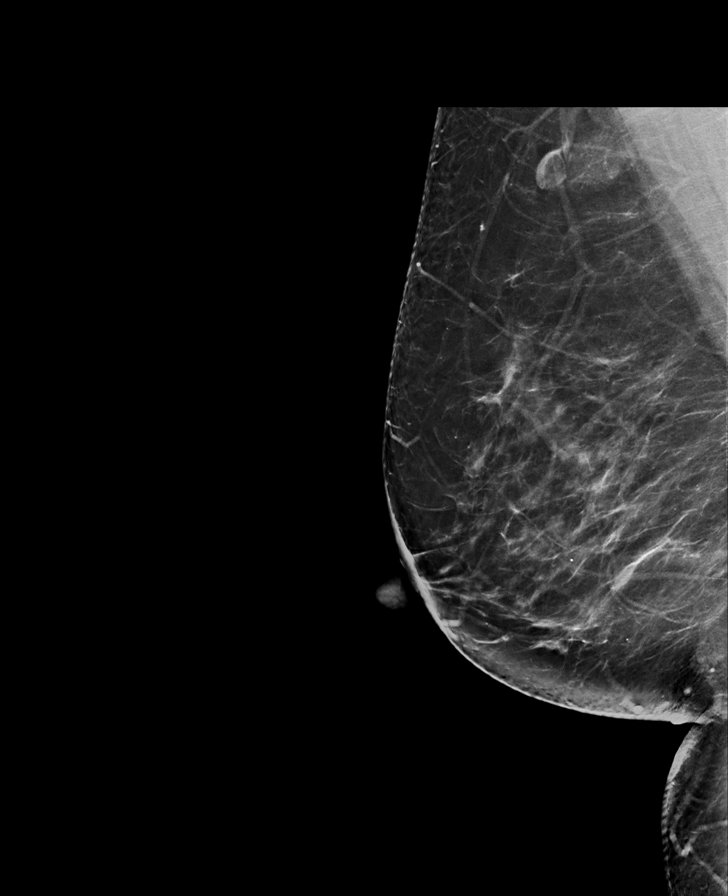

[L CC synth-2D]
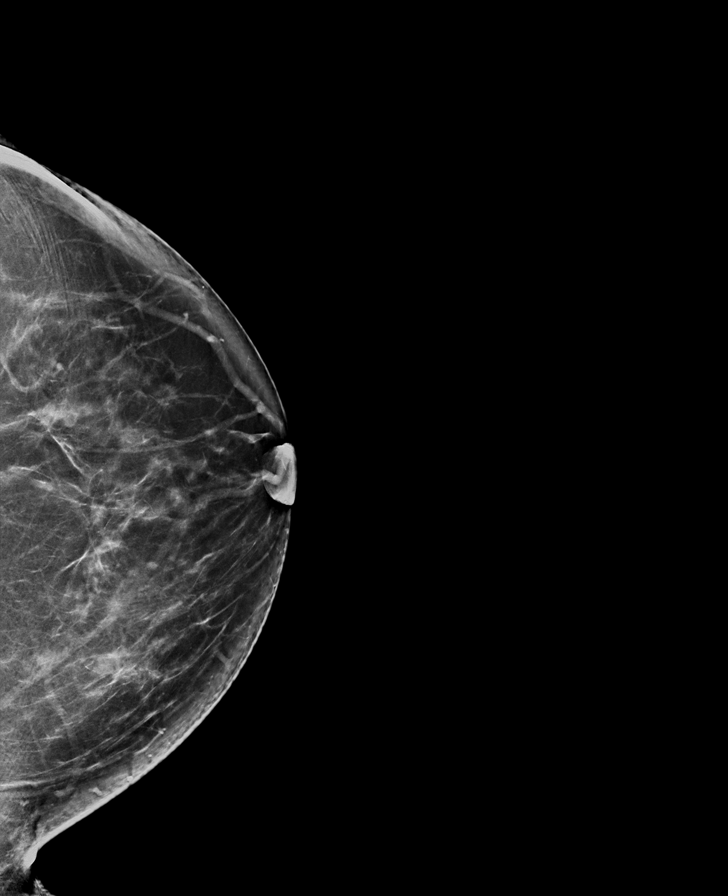

[R CC synth-2D]
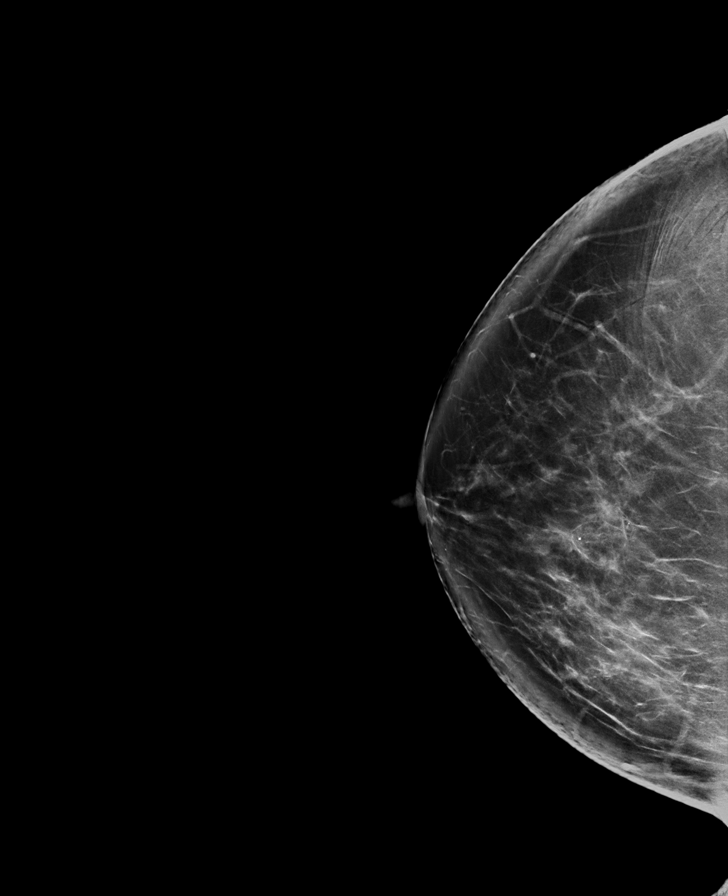

[L MLO synth-2D]
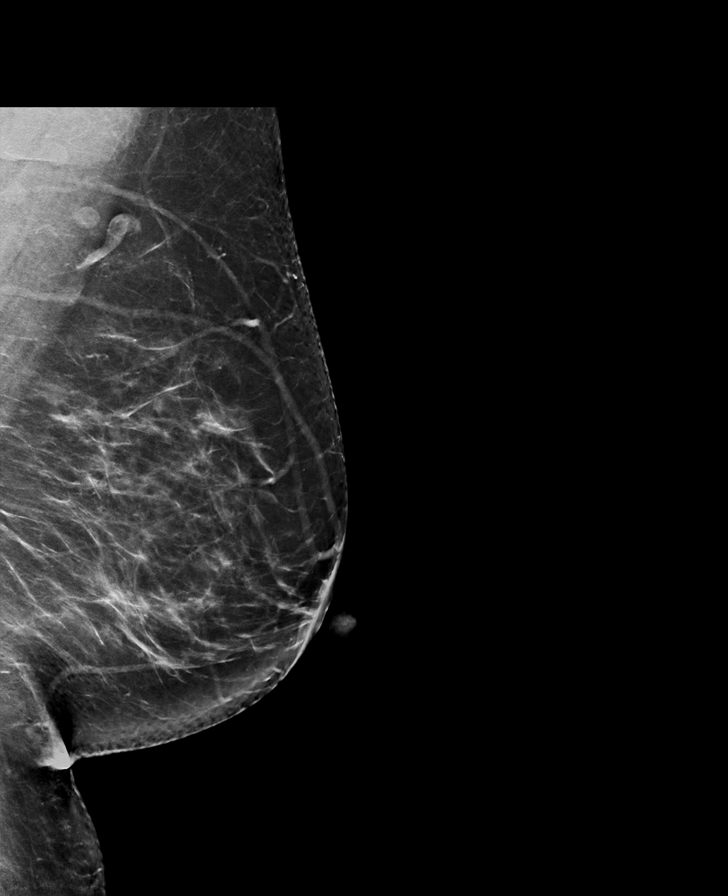

[R MLO tomo · tomo slice 45/90.0]
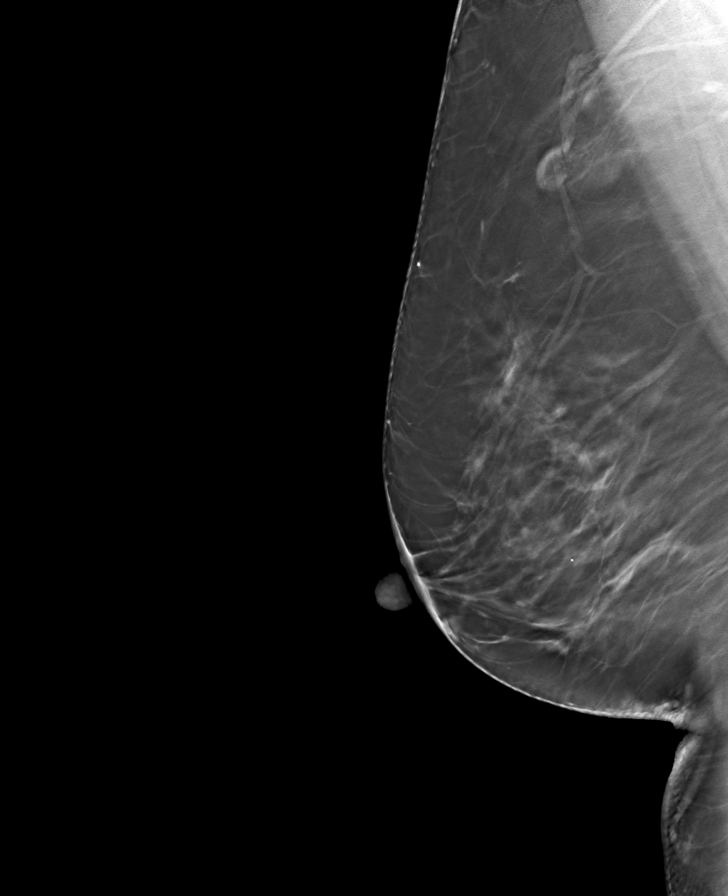

[L CC tomo · tomo slice 45/89.0]
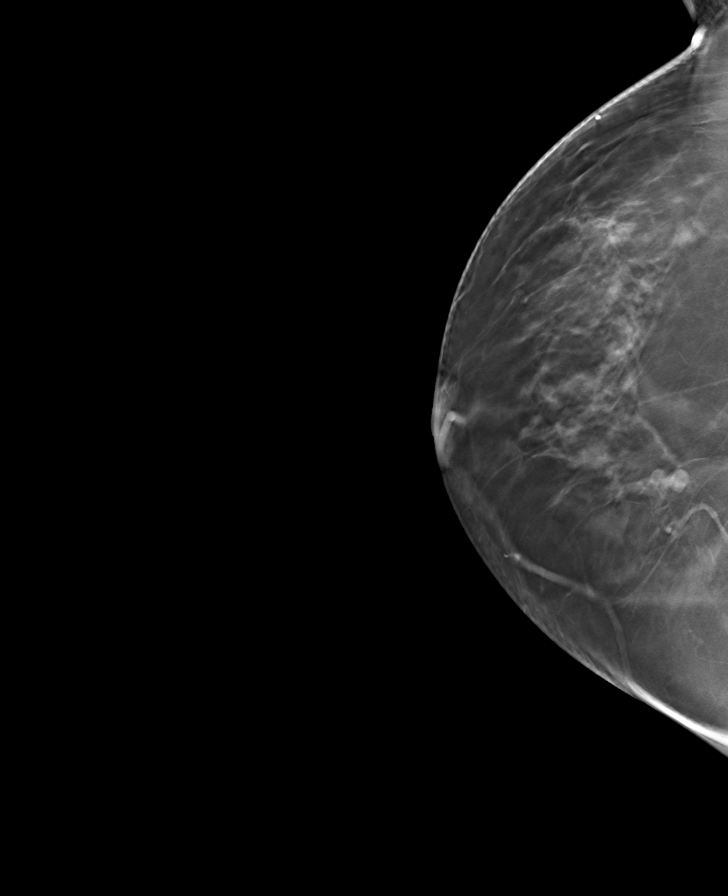

[L MLO tomo · tomo slice 47/92.0]
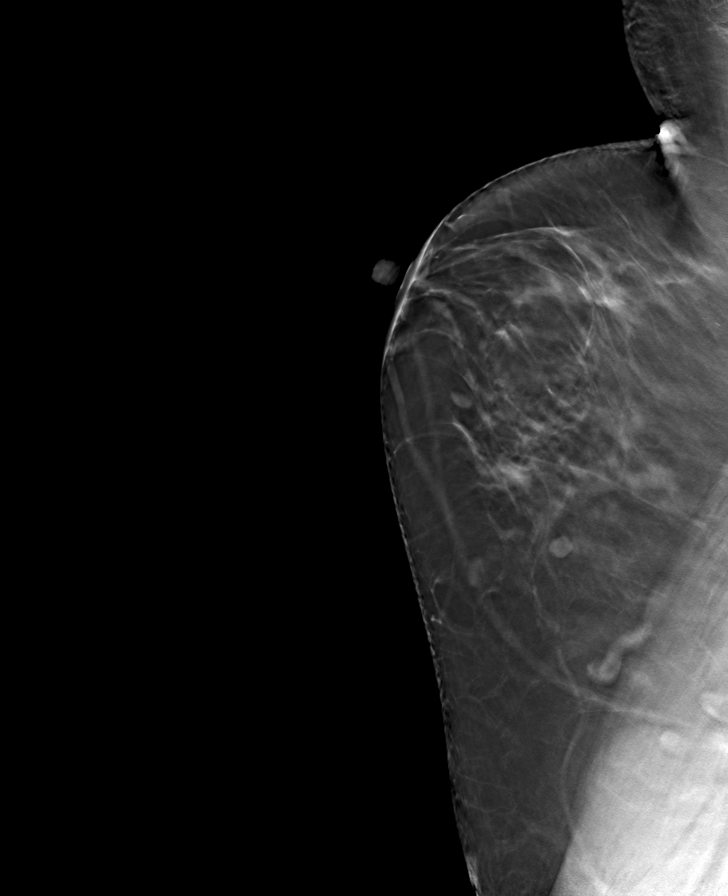

[R CC tomo · tomo slice 47/93.0]
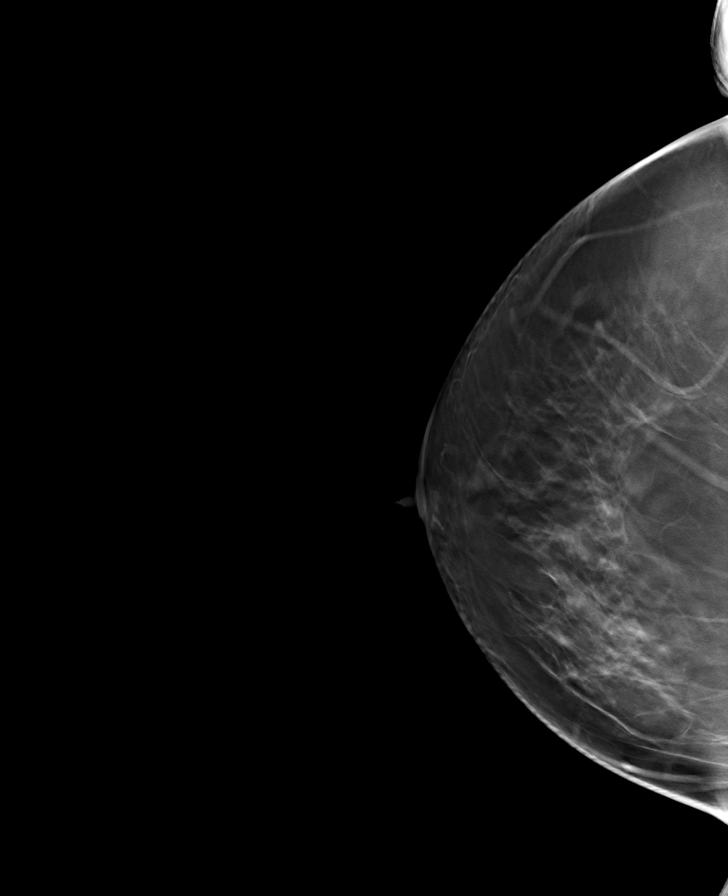

[8 of 24 positions shown; findings below may reference images not displayed]

ACR Breast Density Category b: There are scattered areas of
fibroglandular density.
FINDINGS: There are no findings suspicious for malignancy. Images were
processed with CAD.
IMPRESSION: No mammographic evidence of malignancy. A result letter of this
screening mammogram will be mailed directly to the patient.

RECOMMENDATION:
Screening mammogram in one year. (Code:[id number])

BI-RADS CATEGORY  1: Negative.

## 2019-03-15 ENCOUNTER — Other Ambulatory Visit (HOSPITAL_BASED_OUTPATIENT_CLINIC_OR_DEPARTMENT_OTHER): Payer: Self-pay | Admitting: Obstetrics & Gynecology

## 2019-03-15 ENCOUNTER — Other Ambulatory Visit (HOSPITAL_BASED_OUTPATIENT_CLINIC_OR_DEPARTMENT_OTHER): Payer: Self-pay | Admitting: Ophthalmology

## 2019-03-15 DIAGNOSIS — Z1231 Encounter for screening mammogram for malignant neoplasm of breast: Secondary | ICD-10-CM

## 2019-03-22 ENCOUNTER — Other Ambulatory Visit: Payer: Self-pay

## 2019-03-22 ENCOUNTER — Ambulatory Visit (HOSPITAL_BASED_OUTPATIENT_CLINIC_OR_DEPARTMENT_OTHER)
Admission: RE | Admit: 2019-03-22 | Discharge: 2019-03-22 | Disposition: A | Discharge status: Home / Self Care | Payer: BC Managed Care – PPO | Source: Ambulatory Visit | Attending: Obstetrics & Gynecology | Admitting: Obstetrics & Gynecology

## 2019-03-22 DIAGNOSIS — Z1231 Encounter for screening mammogram for malignant neoplasm of breast: Secondary | ICD-10-CM | POA: Insufficient documentation
